# Patient Record
Sex: Female | Born: 1940 | Race: Black or African American | Hispanic: No | Smoking: Never smoker
Health system: Southern US, Community
[De-identification: ages and names within clinical notes are randomized; demographics above are authoritative.]

## PROBLEM LIST (undated history)

## (undated) DIAGNOSIS — R7303 Prediabetes: Secondary | ICD-10-CM

## (undated) DIAGNOSIS — F0390 Unspecified dementia without behavioral disturbance: Secondary | ICD-10-CM

## (undated) DIAGNOSIS — M1990 Unspecified osteoarthritis, unspecified site: Secondary | ICD-10-CM

## (undated) DIAGNOSIS — N189 Chronic kidney disease, unspecified: Secondary | ICD-10-CM

## (undated) DIAGNOSIS — I10 Essential (primary) hypertension: Secondary | ICD-10-CM

## (undated) DIAGNOSIS — D329 Benign neoplasm of meninges, unspecified: Secondary | ICD-10-CM

## (undated) DIAGNOSIS — I517 Cardiomegaly: Secondary | ICD-10-CM

## (undated) DIAGNOSIS — F32A Depression, unspecified: Secondary | ICD-10-CM

## (undated) DIAGNOSIS — N390 Urinary tract infection, site not specified: Secondary | ICD-10-CM

## (undated) DIAGNOSIS — M797 Fibromyalgia: Secondary | ICD-10-CM

## (undated) HISTORY — PX: HAND SURGERY: SHX662

---

## 2012-10-13 DIAGNOSIS — M5430 Sciatica, unspecified side: Secondary | ICD-10-CM | POA: Insufficient documentation

## 2012-10-13 DIAGNOSIS — M1990 Unspecified osteoarthritis, unspecified site: Secondary | ICD-10-CM | POA: Insufficient documentation

## 2012-10-13 DIAGNOSIS — I10 Essential (primary) hypertension: Secondary | ICD-10-CM | POA: Insufficient documentation

## 2012-12-01 DIAGNOSIS — R011 Cardiac murmur, unspecified: Secondary | ICD-10-CM | POA: Insufficient documentation

## 2013-01-17 DIAGNOSIS — I517 Cardiomegaly: Secondary | ICD-10-CM | POA: Insufficient documentation

## 2015-11-22 DIAGNOSIS — K219 Gastro-esophageal reflux disease without esophagitis: Secondary | ICD-10-CM | POA: Insufficient documentation

## 2017-02-17 DIAGNOSIS — M160 Bilateral primary osteoarthritis of hip: Secondary | ICD-10-CM | POA: Insufficient documentation

## 2019-04-28 DIAGNOSIS — D329 Benign neoplasm of meninges, unspecified: Secondary | ICD-10-CM | POA: Diagnosis present

## 2020-04-24 DIAGNOSIS — D126 Benign neoplasm of colon, unspecified: Secondary | ICD-10-CM | POA: Insufficient documentation

## 2020-09-26 ENCOUNTER — Other Ambulatory Visit: Payer: Self-pay | Admitting: Neurosurgery

## 2020-09-26 DIAGNOSIS — D329 Benign neoplasm of meninges, unspecified: Secondary | ICD-10-CM

## 2020-09-28 ENCOUNTER — Other Ambulatory Visit: Payer: Self-pay | Admitting: Neurosurgery

## 2020-09-28 ENCOUNTER — Other Ambulatory Visit (HOSPITAL_COMMUNITY): Payer: Self-pay | Admitting: Neurosurgery

## 2020-09-28 DIAGNOSIS — D329 Benign neoplasm of meninges, unspecified: Secondary | ICD-10-CM

## 2020-10-16 ENCOUNTER — Encounter: Payer: Self-pay | Admitting: *Deleted

## 2020-10-16 ENCOUNTER — Other Ambulatory Visit: Payer: Self-pay

## 2020-10-16 ENCOUNTER — Emergency Department
Admission: EM | Admit: 2020-10-16 | Discharge: 2020-10-16 | Disposition: A | Discharge status: Home / Self Care | Payer: Medicare Other | Attending: Emergency Medicine | Admitting: Emergency Medicine

## 2020-10-16 DIAGNOSIS — N3090 Cystitis, unspecified without hematuria: Secondary | ICD-10-CM

## 2020-10-16 DIAGNOSIS — I10 Essential (primary) hypertension: Secondary | ICD-10-CM | POA: Diagnosis not present

## 2020-10-16 DIAGNOSIS — Z86011 Personal history of benign neoplasm of the brain: Secondary | ICD-10-CM | POA: Diagnosis not present

## 2020-10-16 DIAGNOSIS — N3000 Acute cystitis without hematuria: Secondary | ICD-10-CM | POA: Insufficient documentation

## 2020-10-16 DIAGNOSIS — R319 Hematuria, unspecified: Secondary | ICD-10-CM | POA: Diagnosis present

## 2020-10-16 HISTORY — DX: Unspecified osteoarthritis, unspecified site: M19.90

## 2020-10-16 HISTORY — DX: Benign neoplasm of meninges, unspecified: D32.9

## 2020-10-16 HISTORY — DX: Essential (primary) hypertension: I10

## 2020-10-16 HISTORY — DX: Cardiomegaly: I51.7

## 2020-10-16 HISTORY — DX: Urinary tract infection, site not specified: N39.0

## 2020-10-16 LAB — URINALYSIS, COMPLETE (UACMP) WITH MICROSCOPIC
Bilirubin Urine: NEGATIVE
Glucose, UA: NEGATIVE mg/dL
Hgb urine dipstick: NEGATIVE
Ketones, ur: NEGATIVE mg/dL
Nitrite: NEGATIVE
Protein, ur: NEGATIVE mg/dL
Specific Gravity, Urine: 1.015 (ref 1.005–1.030)
pH: 5 (ref 5.0–8.0)

## 2020-10-16 LAB — BASIC METABOLIC PANEL
Anion gap: 7 (ref 5–15)
BUN: 36 mg/dL — ABNORMAL HIGH (ref 8–23)
CO2: 22 mmol/L (ref 22–32)
Calcium: 10.5 mg/dL — ABNORMAL HIGH (ref 8.9–10.3)
Chloride: 109 mmol/L (ref 98–111)
Creatinine, Ser: 1.57 mg/dL — ABNORMAL HIGH (ref 0.44–1.00)
GFR, Estimated: 33 mL/min — ABNORMAL LOW (ref >60)
Glucose, Bld: 98 mg/dL (ref 70–99)
Potassium: 4.9 mmol/L (ref 3.5–5.1)
Sodium: 138 mmol/L (ref 135–145)

## 2020-10-16 LAB — CBC WITH DIFFERENTIAL/PLATELET
Abs Immature Granulocytes: 0 10*3/uL (ref 0.00–0.07)
Basophils Absolute: 0 10*3/uL (ref 0.0–0.1)
Basophils Relative: 0 %
Eosinophils Absolute: 0.2 10*3/uL (ref 0.0–0.5)
Eosinophils Relative: 3 %
HCT: 35.2 % — ABNORMAL LOW (ref 36.0–46.0)
Hemoglobin: 11.6 g/dL — ABNORMAL LOW (ref 12.0–15.0)
Immature Granulocytes: 0 %
Lymphocytes Relative: 47 %
Lymphs Abs: 2.3 10*3/uL (ref 0.7–4.0)
MCH: 30.9 pg (ref 26.0–34.0)
MCHC: 33 g/dL (ref 30.0–36.0)
MCV: 93.6 fL (ref 80.0–100.0)
Monocytes Absolute: 0.6 10*3/uL (ref 0.1–1.0)
Monocytes Relative: 12 %
Neutro Abs: 1.9 10*3/uL (ref 1.7–7.7)
Neutrophils Relative %: 38 %
Platelets: 162 10*3/uL (ref 150–400)
RBC: 3.76 MIL/uL — ABNORMAL LOW (ref 3.87–5.11)
RDW: 14.4 % (ref 11.5–15.5)
WBC: 5 10*3/uL (ref 4.0–10.5)
nRBC: 0 % (ref 0.0–0.2)

## 2020-10-16 MED ORDER — SULFAMETHOXAZOLE-TRIMETHOPRIM 800-160 MG PO TABS: 1.0000 | ORAL_TABLET | Freq: Two times a day (BID) | ORAL | 0 refills | Status: AC

## 2020-10-16 MED ORDER — FLUCONAZOLE 150 MG PO TABS: 150.0000 mg | ORAL_TABLET | Freq: Once | ORAL | 0 refills | Status: AC

## 2020-10-16 MED ORDER — SULFAMETHOXAZOLE-TRIMETHOPRIM 800-160 MG PO TABS
1.0000 | ORAL_TABLET | Freq: Once | ORAL | Status: AC
  Administered 2020-10-16: 1 via ORAL
  Filled 2020-10-16: qty 1

## 2020-10-16 MED ORDER — CEPHALEXIN 500 MG PO CAPS: 500.0000 mg | ORAL_CAPSULE | Freq: Once | ORAL | Status: DC

## 2020-10-16 NOTE — ED Triage Notes (Signed)
Pt reports blood in urine and back pain for 1 day.  No n/v/  Hx uti's  Family with pt  Pt alert.

## 2020-10-16 NOTE — ED Notes (Signed)
Pt very anxious about blood draw. This nurse placed 22g IV but vein blew. Will call second nurse for lab draw.

## 2020-10-16 NOTE — ED Provider Notes (Signed)
[facility] Emergency Department Provider Note   ____________________________________________   Event Date/Time   First MD Initiated Contact with Patient 10/16/20 1750     (approximate)  I have reviewed the triage vital signs and the nursing notes.   HISTORY  Chief Complaint Hematuria    HPI Kelli Shaffer is a 80 y.o. female with past medical history of hypertension, osteoarthritis, LVH, and meningioma who presents to the ED complaining of hematuria.  Patient states that she started noticing burning when she urinates when she got up this morning.  She also noticed a small amount of blood in her urine around the same time.  She denies any fevers, abdominal pain, nausea, vomiting, or flank pain.  She has not noticed any blood in her stool and denies any vaginal bleeding.  Daughter states that patient deals with frequent UTIs and current symptoms are similar.        Past Medical History:  Diagnosis Date  . Frequent UTI   . Hypertension   . LVH (left ventricular hypertrophy)   . Meningioma ([place])   . Osteoarthritis     There are no problems to display for this patient.   History reviewed. No pertinent surgical history.  Prior to Admission medications   Medication Sig Start Date End Date Taking? Authorizing Provider  fluconazole (DIFLUCAN) 150 MG tablet Take 1 tablet (150 mg total) by mouth once for 1 dose. Take if symptoms of yeast infection develop 10/16/20 10/16/20 Yes Blake Divine, MD  sulfamethoxazole-trimethoprim (BACTRIM DS) 800-160 MG tablet Take 1 tablet by mouth 2 (two) times daily for 5 days. 10/16/20 10/21/20 Yes Blake Divine, MD    Allergies Penicillins  No family history on file.  Social History Social History   Tobacco Use  . Smoking status: Never Smoker  . Smokeless tobacco: Never Used    Review of Systems  Constitutional: No fever/chills Eyes: No visual changes. ENT: No sore throat. Cardiovascular: Denies chest  pain. Respiratory: Denies shortness of breath. Gastrointestinal: No abdominal pain.  No nausea, no vomiting.  No diarrhea.  No constipation. Genitourinary: Positive for dysuria and hematuria. Musculoskeletal: Negative for back pain. Skin: Negative for rash. Neurological: Negative for headaches, focal weakness or numbness.  ____________________________________________   PHYSICAL EXAM:  VITAL SIGNS: ED Triage Vitals  Enc Vitals Group     BP      Pulse      Resp      Temp      Temp src      SpO2      Weight      Height      Head Circumference      Peak Flow      Pain Score      Pain Loc      Pain Edu?      Excl. in [place]?     Constitutional: Alert and oriented. Eyes: Conjunctivae are normal. Head: Atraumatic. Nose: No congestion/rhinnorhea. Mouth/Throat: Mucous membranes are moist. Neck: Normal ROM Cardiovascular: Normal rate, regular rhythm. Grossly normal heart sounds. Respiratory: Normal respiratory effort.  No retractions. Lungs CTAB. Gastrointestinal: Soft and nontender.  No CVA tenderness bilaterally.  No distention. Genitourinary: deferred Musculoskeletal: No lower extremity tenderness nor edema. Neurologic:  Normal speech and language. No gross focal neurologic deficits are appreciated. Skin:  Skin is warm, dry and intact. No rash noted. Psychiatric: Mood and affect are normal. Speech and behavior are normal.  ____________________________________________   LABS (all labs ordered are listed, but only abnormal  results are displayed)  Labs Reviewed  BASIC METABOLIC PANEL - Abnormal; Notable for the following components:      Result Value   BUN 36 (*)    Creatinine, Ser 1.57 (*)    Calcium 10.5 (*)    GFR, Estimated 33 (*)    All other components within normal limits  CBC WITH DIFFERENTIAL/PLATELET - Abnormal; Notable for the following components:   RBC 3.76 (*)    Hemoglobin 11.6 (*)    HCT 35.2 (*)    All other components within normal limits   URINALYSIS, COMPLETE (UACMP) WITH MICROSCOPIC - Abnormal; Notable for the following components:   Color, Urine YELLOW (*)    APPearance HAZY (*)    Leukocytes,Ua TRACE (*)    Bacteria, UA MANY (*)    All other components within normal limits  URINE CULTURE    PROCEDURES  Procedure(s) performed (including Critical Care):  Procedures   ____________________________________________   INITIAL IMPRESSION / ASSESSMENT AND PLAN / ED COURSE       80 year old female with past medical history of hypertension, osteoarthritis, LVH, and meningioma who presents to the ED for dysuria and hematuria starting this morning.  Patient is well-appearing with reassuring vital signs, no CVA tenderness to suggest pyelonephritis.  We will check UA and basic labs, suspect her symptoms are due to UTI.  UA concerning for infection, we will send for culture and treat with Bactrim given her penicillin allergy.  Labs are reassuring, renal function similar to previous.  Patient's daughter is requesting dose of Diflucan to be prescribed as she frequently develops yeast infections following UTI treatment.  Daughter counseled to have patient return to the ED for any new or worsening symptoms, daughter agrees with plan.      ____________________________________________   FINAL CLINICAL IMPRESSION(S) / ED DIAGNOSES  Final diagnoses:  Cystitis     ED Discharge Orders         Ordered    sulfamethoxazole-trimethoprim (BACTRIM DS) 800-160 MG tablet  2 times daily        10/16/20 2029    fluconazole (DIFLUCAN) 150 MG tablet   Once        10/16/20 2029           Note:  This document was prepared using Dragon voice recognition software and may include unintentional dictation errors.   Blake Divine, MD 10/16/20 2030

## 2020-10-16 NOTE — ED Notes (Signed)
Pt to ED because she believes she has UTI. Has pain and burning with urination that began this morning. Only has pain with voiding. In NAD, daughter at bedside.

## 2020-10-19 LAB — URINE CULTURE: Culture: >=100000 — AB

## 2020-11-08 DIAGNOSIS — Z01818 Encounter for other preprocedural examination: Secondary | ICD-10-CM | POA: Insufficient documentation

## 2020-12-18 ENCOUNTER — Ambulatory Visit: Payer: Self-pay

## 2020-12-25 ENCOUNTER — Other Ambulatory Visit: Payer: Self-pay

## 2020-12-25 ENCOUNTER — Ambulatory Visit
Admission: RE | Admit: 2020-12-25 | Discharge: 2020-12-25 | Disposition: A | Discharge status: Home / Self Care | Payer: Medicare Other | Source: Ambulatory Visit | Attending: Neurosurgery | Admitting: Neurosurgery

## 2020-12-25 DIAGNOSIS — D329 Benign neoplasm of meninges, unspecified: Secondary | ICD-10-CM | POA: Diagnosis present

## 2020-12-25 MED ORDER — GADOBUTROL 1 MMOL/ML IV SOLN
8.0000 mL | Freq: Once | INTRAVENOUS | Status: AC | PRN
  Administered 2020-12-25: 8 mL via INTRAVENOUS

## 2020-12-25 MED ORDER — GADOBUTROL 1 MMOL/ML IV SOLN: 10.0000 mL | Freq: Once | INTRAVENOUS | Status: DC | PRN

## 2021-01-31 ENCOUNTER — Other Ambulatory Visit: Payer: Self-pay | Admitting: Radiation Therapy

## 2021-02-06 ENCOUNTER — Other Ambulatory Visit: Payer: Self-pay | Admitting: Neurosurgery

## 2021-02-08 ENCOUNTER — Other Ambulatory Visit: Payer: Self-pay | Admitting: Radiation Therapy

## 2021-02-08 ENCOUNTER — Other Ambulatory Visit: Payer: Self-pay | Admitting: Neurosurgery

## 2021-02-25 NOTE — Pre-Procedure Instructions (Signed)
Surgical Instructions    Your procedure is scheduled on Tuesday, August 30th.  Report to [facility] Main Entrance "A" at 5:30 A.M., then check in with the Admitting office.  Call this number if you have problems the morning of surgery:  [phone]   If you have any questions prior to your surgery date call [phone]: Open Monday-Friday 8am-4pm    Remember:  Do not eat or drink after midnight the night before your surgery    Take these medicines the morning of surgery with A SIP OF WATER  Carvedilol (coreg) Acetaminophen (Tylenol)-as needed Tramadol (ultram)-as needed for pain.  As of today, STOP taking any Aspirin (unless otherwise instructed by your surgeon) Aleve, Naproxen, Ibuprofen, Motrin, Advil, Goody's, BC's, all herbal medications, fish oil, and all vitamins.                     Do NOT Smoke (Tobacco/Vaping) or drink Alcohol 24 hours prior to your procedure.  If you use a CPAP at night, you may bring all equipment for your overnight stay.   Contacts, glasses, piercing's, hearing aid's, dentures or partials may not be worn into surgery, please bring cases for these belongings.    For patients admitted to the hospital, discharge time will be determined by your treatment team.   Patients discharged the day of surgery will not be allowed to drive home, and someone needs to stay with them for 24 hours.  ONLY 1 SUPPORT PERSON MAY BE PRESENT WHILE YOU ARE IN SURGERY. IF YOU ARE TO BE ADMITTED ONCE YOU ARE IN YOUR ROOM YOU WILL BE ALLOWED TWO (2) VISITORS.  Minor children may have two parents present. Special consideration for safety and communication needs will be reviewed on a case by case basis.   Special instructions:   [place]- Preparing For Surgery  Before surgery, you can play an important role. Because skin is not sterile, your skin needs to be as free of germs as possible. You can reduce the number of germs on your skin by washing with CHG (chlorahexidine  gluconate) Soap before surgery.  CHG is an antiseptic cleaner which kills germs and bonds with the skin to continue killing germs even after washing.    Oral Hygiene is also important to reduce your risk of infection.  Remember - BRUSH YOUR TEETH THE MORNING OF SURGERY WITH YOUR REGULAR TOOTHPASTE  Please do not use if you have an allergy to CHG or antibacterial soaps. If your skin becomes reddened/irritated stop using the CHG.  Do not shave (including legs and underarms) for at least 48 hours prior to first CHG shower. It is OK to shave your face.  Please follow these instructions carefully.   Shower the NIGHT BEFORE SURGERY and the MORNING OF SURGERY  If you chose to wash your hair, wash your hair first as usual with your normal shampoo.  After you shampoo, rinse your hair and body thoroughly to remove the shampoo.  Use CHG Soap as you would any other liquid soap. You can apply CHG directly to the skin and wash gently with a scrungie or a clean washcloth.   Apply the CHG Soap to your body ONLY FROM THE NECK DOWN.  Do not use on open wounds or open sores. Avoid contact with your eyes, ears, mouth and genitals (private parts). Wash Face and genitals (private parts)  with your normal soap.   Wash thoroughly, paying special attention to the area where your surgery will be performed.  Thoroughly rinse your body with warm water from the neck down.  DO NOT shower/wash with your normal soap after using and rinsing off the CHG Soap.  Pat yourself dry with a CLEAN TOWEL.  Wear CLEAN PAJAMAS to bed the night before surgery  Place CLEAN SHEETS on your bed the night before your surgery  DO NOT SLEEP WITH PETS.   Day of Surgery: Shower with CHG soap. Do not wear jewelry, make up, nail polish, gel polish, artificial nails, or any other type of covering on natural nails including finger and toenails. If patients have artificial nails, gel coating, etc. that need to be removed by a nail salon  please have this removed prior to surgery. Surgery may need to be canceled/delayed if the surgeon/ anesthesia feels like the patient is unable to be adequately monitored. Do not wear lotions, powders, perfumes, or deodorant. Do not shave 48 hours prior to surgery.   Do not bring valuables to the hospital. [facility] is not responsible for any belongings or valuables. Wear Clean/Comfortable clothing the morning of surgery Remember to brush your teeth WITH YOUR REGULAR TOOTHPASTE.   Please read over the following fact sheets that you were given.

## 2021-02-26 ENCOUNTER — Encounter (HOSPITAL_COMMUNITY): Payer: Self-pay

## 2021-02-26 ENCOUNTER — Encounter (HOSPITAL_COMMUNITY)
Admission: RE | Admit: 2021-02-26 | Discharge: 2021-02-26 | Disposition: A | Discharge status: Home / Self Care | Payer: Medicare Other | Source: Ambulatory Visit | Attending: Neurosurgery | Admitting: Neurosurgery

## 2021-02-26 ENCOUNTER — Other Ambulatory Visit: Payer: Self-pay

## 2021-02-26 DIAGNOSIS — Z01818 Encounter for other preprocedural examination: Secondary | ICD-10-CM | POA: Insufficient documentation

## 2021-02-26 LAB — CBC
HCT: 36.8 % (ref 36.0–46.0)
Hemoglobin: 11.5 g/dL — ABNORMAL LOW (ref 12.0–15.0)
MCH: 31.2 pg (ref 26.0–34.0)
MCHC: 31.3 g/dL (ref 30.0–36.0)
MCV: 99.7 fL (ref 80.0–100.0)
Platelets: 173 10*3/uL (ref 150–400)
RBC: 3.69 MIL/uL — ABNORMAL LOW (ref 3.87–5.11)
RDW: 14.1 % (ref 11.5–15.5)
WBC: 5.9 10*3/uL (ref 4.0–10.5)
nRBC: 0 % (ref 0.0–0.2)

## 2021-02-26 LAB — BASIC METABOLIC PANEL
Anion gap: 5 (ref 5–15)
BUN: 26 mg/dL — ABNORMAL HIGH (ref 8–23)
CO2: 26 mmol/L (ref 22–32)
Calcium: 9.9 mg/dL (ref 8.9–10.3)
Chloride: 108 mmol/L (ref 98–111)
Creatinine, Ser: 1.66 mg/dL — ABNORMAL HIGH (ref 0.44–1.00)
GFR, Estimated: 31 mL/min — ABNORMAL LOW (ref >60)
Glucose, Bld: 122 mg/dL — ABNORMAL HIGH (ref 70–99)
Potassium: 4.3 mmol/L (ref 3.5–5.1)
Sodium: 139 mmol/L (ref 135–145)

## 2021-02-26 LAB — TYPE AND SCREEN
ABO/RH(D): O NEG
Antibody Screen: NEGATIVE

## 2021-02-26 NOTE — Progress Notes (Signed)
PCP - Dr. Valarie Cones Cardiologist - denies  Chest x-ray - n/a EKG - 02/26/21 Stress Test - ? Possibly in 2004 in [place]. No follow-up needed. Reported to be normal. ECHO - 2014 at [facility]. Records requested.  Cardiac Cath - denies  Sleep Study - denies CPAP - denies  Blood Thinner Instructions: n/a Aspirin Instructions: n/a  COVID TEST- Pt lives in [place] and daughter stated that she would not be coming back to [facility] for a covid test. Pt told that she could use an outside source as long as it was a PCR test and it was done within 4 days of surgery. In the event that this could not be obtained, daughter was given directions, instructions, map and requisition form for testing.   Anesthesia review: Yes, pending cardiac records and EKG review.   Patient denies shortness of breath, fever, cough and chest pain at PAT appointment   All instructions explained to the patient, with a verbal understanding of the material. Patient agrees to go over the instructions while at home for a better understanding. Patient also instructed to self quarantine after being tested for COVID-19. The opportunity to ask questions was provided.

## 2021-02-27 DIAGNOSIS — M5137 Other intervertebral disc degeneration, lumbosacral region: Secondary | ICD-10-CM | POA: Insufficient documentation

## 2021-02-27 DIAGNOSIS — M4712 Other spondylosis with myelopathy, cervical region: Secondary | ICD-10-CM | POA: Insufficient documentation

## 2021-02-27 NOTE — Progress Notes (Signed)
Anesthesia Chart Review:  Patient was evaluated by cardiologist Dr. Newman Pies at [place] in 2014 for abnormal EKG and murmur.  EKG 11/17/12 that showed NSR, LVH with repolarization abnormalities, anterior ST elevation, probably due to LVH (copy of this EKG is on patient's physical chart).  Dr. Newman Pies ordered echocardiogram which was done 12/29/2012 showed moderate LVH, normal LV systolic function with near obliteration of the LV cavity, EF 80%.  No wall motion abnormalities, no LVOT gradient.  No significant valvular disease.  Preop labs reviewed, creatinine elevated 1.66 (stable compared with labs from 10/16/2020 showing creatinine 1.57), mild anemia with hemoglobin 11.5, otherwise unremarkable.  Reviewed patient history and EKG tracings with Dr. Tobias Alexander.  He advised patient okay to proceed with surgery as planned barring acute status change.  EKG 02/26/21: Sinus bradycardia. Rate 54. T wave abnormality, consider lateral ischemia  TTE 12/29/2012 (see copy on chart): Preparation: Moderate concentric LVH. Normal LV systolic function with near obliteration of the LV cavity, EF 80%.  No focal wall motion abnormalities. No LVOT gradient. Mild diastolic dysfunction (impaired relaxation). Mild left atrial enlargement. Mild aortic sclerosis, no stenosis or regurgitation. No significant valvular disease. Normal right heart.    Wynonia Musty [facility] Short Stay Center/Anesthesiology Phone [phone] 02/28/2021 9:47 AM

## 2021-02-28 NOTE — Anesthesia Preprocedure Evaluation (Addendum)
Anesthesia Evaluation  Patient identified by MRN, date of birth, ID band Patient awake    Reviewed: Allergy & Precautions, NPO status , Patient's Chart, lab work & pertinent test results, reviewed documented beta blocker date and time   History of Anesthesia Complications (+) history of anesthetic complications  Airway Mallampati: II  TM Distance: >3 FB Neck ROM: Full    Dental  (+) Missing, Dental Advisory Given   Pulmonary neg pulmonary ROS,    Pulmonary exam normal        Cardiovascular hypertension, Pt. on home beta blockers Normal cardiovascular exam  Patient was evaluated by cardiologist Dr. Newman Pies at [place] in 2014 for abnormal EKG and murmur.  EKG 11/17/12 that showed NSR, LVH with repolarization abnormalities, anterior ST elevation, probably due to LVH (copy of this EKG is on patient's physical chart).  Dr. Newman Pies ordered echocardiogram which was done 12/29/2012 showed moderate LVH, normal LV systolic function with near obliteration of the LV cavity, EF 80%.  No wall motion abnormalities, no LVOT gradient.  No significant valvular disease.   Neuro/Psych negative neurological ROS     GI/Hepatic negative GI ROS, Neg liver ROS,   Endo/Other  negative endocrine ROS  Renal/GU negative Renal ROS     Musculoskeletal negative musculoskeletal ROS (+)   Abdominal   Peds  Hematology negative hematology ROS (+)   Anesthesia Other Findings   Reproductive/Obstetrics                           Anesthesia Physical Anesthesia Plan  ASA: 3  Anesthesia Plan: General   Post-op Pain Management:    Induction: Intravenous  PONV Risk Score and Plan: 4 or greater and Ondansetron, Dexamethasone, Treatment may vary due to age or medical condition and Diphenhydramine  Airway Management Planned: Oral ETT  Additional Equipment: Arterial line  Intra-op Plan:   Post-operative Plan:  Extubation in OR  Informed Consent: I have reviewed the patients History and Physical, chart, labs and discussed the procedure including the risks, benefits and alternatives for the proposed anesthesia with the patient or authorized representative who has indicated his/her understanding and acceptance.     Dental advisory given  Plan Discussed with: Anesthesiologist and CRNA  Anesthesia Plan Comments: (PAT note by Karoline Caldwell, PA-C: Patient was evaluated by cardiologist Dr. Newman Pies at [place] in 2014 for abnormal EKG and murmur.  EKG 11/17/12 that showed NSR, LVH with repolarization abnormalities, anterior ST elevation, probably due to LVH (copy of this EKG is on patient's physical chart).  Dr. Newman Pies ordered echocardiogram which was done 12/29/2012 showed moderate LVH, normal LV systolic function with near obliteration of the LV cavity, EF 80%.  No wall motion abnormalities, no LVOT gradient.  No significant valvular disease.  Preop labs reviewed, creatinine elevated 1.66 (stable compared with labs from 10/16/2020 showing creatinine 1.57), mild anemia with hemoglobin 11.5, otherwise unremarkable.  Reviewed patient history and EKG tracings with Dr. Tobias Alexander.  He advised patient okay to proceed with surgery as planned barring acute status change.  EKG 02/26/21: Sinus bradycardia. Rate 54. T wave abnormality, consider lateral ischemia  TTE 12/29/2012 (see copy on chart): Preparation: Moderate concentric LVH. Normal LV systolic function with near obliteration of the LV cavity, EF 80%.  No focal wall motion abnormalities. No LVOT gradient. Mild diastolic dysfunction (impaired relaxation). Mild left atrial enlargement. Mild aortic sclerosis, no stenosis or regurgitation. No significant valvular disease. Normal right heart. )  Anesthesia Quick Evaluation  

## 2021-03-01 ENCOUNTER — Other Ambulatory Visit: Payer: Self-pay

## 2021-03-01 ENCOUNTER — Other Ambulatory Visit
Admission: RE | Admit: 2021-03-01 | Discharge: 2021-03-01 | Disposition: A | Discharge status: Home / Self Care | Payer: Medicare Other | Source: Ambulatory Visit | Attending: Neurosurgery | Admitting: Neurosurgery

## 2021-03-01 DIAGNOSIS — Z20822 Contact with and (suspected) exposure to covid-19: Secondary | ICD-10-CM | POA: Diagnosis not present

## 2021-03-01 DIAGNOSIS — Z01812 Encounter for preprocedural laboratory examination: Secondary | ICD-10-CM | POA: Diagnosis present

## 2021-03-01 LAB — SARS CORONAVIRUS 2 (TAT 6-24 HRS): SARS Coronavirus 2: NEGATIVE

## 2021-03-05 ENCOUNTER — Encounter (HOSPITAL_COMMUNITY): Payer: Self-pay | Admitting: Neurosurgery

## 2021-03-05 ENCOUNTER — Inpatient Hospital Stay (HOSPITAL_COMMUNITY): Payer: Medicare Other

## 2021-03-05 ENCOUNTER — Inpatient Hospital Stay (HOSPITAL_COMMUNITY)
Admission: RE | Admit: 2021-03-05 | Discharge: 2021-03-07 | DRG: 025 | Disposition: A | Discharge status: Home Health | Payer: Medicare Other | Attending: Neurosurgery | Admitting: Neurosurgery

## 2021-03-05 ENCOUNTER — Inpatient Hospital Stay (HOSPITAL_COMMUNITY): Payer: Medicare Other | Admitting: Physician Assistant

## 2021-03-05 ENCOUNTER — Encounter (HOSPITAL_COMMUNITY)
Admission: RE | Disposition: A | Discharge status: Home Health | Payer: Self-pay | Source: Home / Self Care | Attending: Neurosurgery

## 2021-03-05 ENCOUNTER — Other Ambulatory Visit: Payer: Self-pay

## 2021-03-05 DIAGNOSIS — F0390 Unspecified dementia without behavioral disturbance: Secondary | ICD-10-CM | POA: Diagnosis not present

## 2021-03-05 DIAGNOSIS — Z79899 Other long term (current) drug therapy: Secondary | ICD-10-CM | POA: Diagnosis not present

## 2021-03-05 DIAGNOSIS — D329 Benign neoplasm of meninges, unspecified: Secondary | ICD-10-CM | POA: Diagnosis present

## 2021-03-05 DIAGNOSIS — Z20822 Contact with and (suspected) exposure to covid-19: Secondary | ICD-10-CM | POA: Diagnosis not present

## 2021-03-05 DIAGNOSIS — G936 Cerebral edema: Secondary | ICD-10-CM | POA: Diagnosis not present

## 2021-03-05 DIAGNOSIS — D320 Benign neoplasm of cerebral meninges: Principal | ICD-10-CM | POA: Diagnosis present

## 2021-03-05 DIAGNOSIS — I10 Essential (primary) hypertension: Secondary | ICD-10-CM | POA: Diagnosis not present

## 2021-03-05 DIAGNOSIS — Z452 Encounter for adjustment and management of vascular access device: Principal | ICD-10-CM

## 2021-03-05 HISTORY — PX: CRANIOTOMY: SHX93

## 2021-03-05 HISTORY — PX: APPLICATION OF CRANIAL NAVIGATION: SHX6578

## 2021-03-05 LAB — ABO/RH: ABO/RH(D): O NEG

## 2021-03-05 LAB — POCT I-STAT 7, (LYTES, BLD GAS, ICA,H+H)
Acid-base deficit: 4 mmol/L — ABNORMAL HIGH (ref 0.0–2.0)
Bicarbonate: 21.5 mmol/L (ref 20.0–28.0)
Calcium, Ion: 1.26 mmol/L (ref 1.15–1.40)
HCT: 28 % — ABNORMAL LOW (ref 36.0–46.0)
Hemoglobin: 9.5 g/dL — ABNORMAL LOW (ref 12.0–15.0)
O2 Saturation: 100 %
Potassium: 3.9 mmol/L (ref 3.5–5.1)
Sodium: 131 mmol/L — ABNORMAL LOW (ref 135–145)
TCO2: 23 mmol/L (ref 22–32)
pCO2 arterial: 38.6 mmHg (ref 32.0–48.0)
pH, Arterial: 7.354 (ref 7.350–7.450)
pO2, Arterial: 182 mmHg — ABNORMAL HIGH (ref 83.0–108.0)

## 2021-03-05 LAB — MRSA NEXT GEN BY PCR, NASAL: MRSA by PCR Next Gen: NOT DETECTED

## 2021-03-05 SURGERY — CRANIOTOMY TUMOR EXCISION
Anesthesia: General | Laterality: Right

## 2021-03-05 MED ORDER — VANCOMYCIN HCL 1000 MG IV SOLR
INTRAVENOUS | Status: DC | PRN
  Administered 2021-03-05: 1000 mg via INTRAVENOUS

## 2021-03-05 MED ORDER — PHENYLEPHRINE HCL (PRESSORS) 10 MG/ML IV SOLN
INTRAVENOUS | Status: DC | PRN
  Administered 2021-03-05 (×3): 80 ug via INTRAVENOUS

## 2021-03-05 MED ORDER — SODIUM CHLORIDE 0.9 % IV SOLN: INTRAVENOUS | Status: DC | PRN

## 2021-03-05 MED ORDER — DEXAMETHASONE SODIUM PHOSPHATE 4 MG/ML IJ SOLN: 4.0000 mg | Freq: Three times a day (TID) | INTRAMUSCULAR | Status: DC

## 2021-03-05 MED ORDER — BACITRACIN ZINC 500 UNIT/GM EX OINT
TOPICAL_OINTMENT | CUTANEOUS | Status: AC
  Filled 2021-03-05: qty 28.35

## 2021-03-05 MED ORDER — DEXAMETHASONE SODIUM PHOSPHATE 4 MG/ML IJ SOLN
4.0000 mg | Freq: Four times a day (QID) | INTRAMUSCULAR | Status: AC
  Administered 2021-03-06 – 2021-03-07 (×4): 4 mg via INTRAVENOUS
  Filled 2021-03-05 (×4): qty 1

## 2021-03-05 MED ORDER — LIDOCAINE 2% (20 MG/ML) 5 ML SYRINGE
INTRAMUSCULAR | Status: DC | PRN
Start: 2021-03-05 — End: 2021-03-05
  Administered 2021-03-05: 100 mg via INTRAVENOUS

## 2021-03-05 MED ORDER — DEXAMETHASONE SODIUM PHOSPHATE 10 MG/ML IJ SOLN
INTRAMUSCULAR | Status: DC | PRN
Start: 2021-03-05 — End: 2021-03-05
  Administered 2021-03-05: 10 mg via INTRAVENOUS

## 2021-03-05 MED ORDER — ONDANSETRON HCL 4 MG/2ML IJ SOLN
INTRAMUSCULAR | Status: DC | PRN
Start: 2021-03-05 — End: 2021-03-05
  Administered 2021-03-05: 4 mg via INTRAVENOUS

## 2021-03-05 MED ORDER — ONDANSETRON HCL 4 MG/2ML IJ SOLN: 4.0000 mg | INTRAMUSCULAR | Status: DC | PRN

## 2021-03-05 MED ORDER — HYDROCODONE-ACETAMINOPHEN 5-325 MG PO TABS
1.0000 | ORAL_TABLET | ORAL | Status: DC | PRN
  Administered 2021-03-05: 1 via ORAL

## 2021-03-05 MED ORDER — SODIUM CHLORIDE 0.9 % IV SOLN: INTRAVENOUS | Status: DC

## 2021-03-05 MED ORDER — LACTATED RINGERS IV SOLN: INTRAVENOUS | Status: DC

## 2021-03-05 MED ORDER — LIDOCAINE-EPINEPHRINE 1 %-1:100000 IJ SOLN
INTRAMUSCULAR | Status: DC | PRN
  Administered 2021-03-05: 7 mL

## 2021-03-05 MED ORDER — MANNITOL 25 % IV SOLN
INTRAVENOUS | Status: DC | PRN
  Administered 2021-03-05: 50 g via INTRAVENOUS

## 2021-03-05 MED ORDER — PROMETHAZINE HCL 25 MG PO TABS: 12.5000 mg | ORAL_TABLET | ORAL | Status: DC | PRN

## 2021-03-05 MED ORDER — FENTANYL CITRATE (PF) 250 MCG/5ML IJ SOLN
INTRAMUSCULAR | Status: DC | PRN
  Administered 2021-03-05 (×5): 50 ug via INTRAVENOUS

## 2021-03-05 MED ORDER — FLUTICASONE PROPIONATE 50 MCG/ACT NA SUSP
1.0000 | Freq: Every day | NASAL | Status: DC
  Administered 2021-03-06: 2 via NASAL
  Administered 2021-03-07: 1 via NASAL
  Filled 2021-03-05: qty 16

## 2021-03-05 MED ORDER — ROCURONIUM BROMIDE 10 MG/ML (PF) SYRINGE
PREFILLED_SYRINGE | INTRAVENOUS | Status: DC | PRN
  Administered 2021-03-05: 80 mg via INTRAVENOUS

## 2021-03-05 MED ORDER — PHENYLEPHRINE HCL-NACL 20-0.9 MG/250ML-% IV SOLN
INTRAVENOUS | Status: DC | PRN
  Administered 2021-03-05: 25 ug/min via INTRAVENOUS

## 2021-03-05 MED ORDER — BACITRACIN ZINC 500 UNIT/GM EX OINT
TOPICAL_OINTMENT | CUTANEOUS | Status: DC | PRN
  Administered 2021-03-05: 1 via TOPICAL

## 2021-03-05 MED ORDER — LABETALOL HCL 5 MG/ML IV SOLN
10.0000 mg | INTRAVENOUS | Status: DC | PRN
  Administered 2021-03-05: 10 mg via INTRAVENOUS
  Administered 2021-03-05: 20 mg via INTRAVENOUS
  Administered 2021-03-05: 10 mg via INTRAVENOUS
  Administered 2021-03-05 (×2): 20 mg via INTRAVENOUS
  Administered 2021-03-05 (×2): 10 mg via INTRAVENOUS
  Administered 2021-03-05: 20 mg via INTRAVENOUS
  Filled 2021-03-05 (×2): qty 4
  Filled 2021-03-05: qty 8

## 2021-03-05 MED ORDER — DEXAMETHASONE SODIUM PHOSPHATE 10 MG/ML IJ SOLN
6.0000 mg | Freq: Four times a day (QID) | INTRAMUSCULAR | Status: DC
  Administered 2021-03-05: 6 mg via INTRAVENOUS
  Filled 2021-03-05: qty 0.6

## 2021-03-05 MED ORDER — PROPOFOL 10 MG/ML IV BOLUS
INTRAVENOUS | Status: DC | PRN
  Administered 2021-03-05: 100 mg via INTRAVENOUS
  Administered 2021-03-05: 40 mg via INTRAVENOUS
  Administered 2021-03-05 (×2): 30 mg via INTRAVENOUS

## 2021-03-05 MED ORDER — CEFAZOLIN SODIUM-DEXTROSE 2-4 GM/100ML-% IV SOLN: 2.0000 g | Freq: Three times a day (TID) | INTRAVENOUS | Status: DC

## 2021-03-05 MED ORDER — ALBUMIN HUMAN 5 % IV SOLN: INTRAVENOUS | Status: DC | PRN

## 2021-03-05 MED ORDER — 0.9 % SODIUM CHLORIDE (POUR BTL) OPTIME
TOPICAL | Status: DC | PRN
  Administered 2021-03-05: 2000 mL
  Administered 2021-03-05: 1000 mL

## 2021-03-05 MED ORDER — DEXAMETHASONE SODIUM PHOSPHATE 4 MG/ML IJ SOLN
4.0000 mg | Freq: Four times a day (QID) | INTRAMUSCULAR | Status: DC
  Filled 2021-03-05: qty 1

## 2021-03-05 MED ORDER — LABETALOL HCL 5 MG/ML IV SOLN
INTRAVENOUS | Status: AC
  Filled 2021-03-05: qty 4

## 2021-03-05 MED ORDER — PANTOPRAZOLE SODIUM 40 MG IV SOLR
40.0000 mg | Freq: Every day | INTRAVENOUS | Status: DC
  Administered 2021-03-05 – 2021-03-06 (×2): 40 mg via INTRAVENOUS
  Filled 2021-03-05 (×2): qty 40

## 2021-03-05 MED ORDER — THROMBIN 5000 UNITS EX SOLR
CUTANEOUS | Status: AC
  Filled 2021-03-05: qty 5000

## 2021-03-05 MED ORDER — HYDROCODONE-ACETAMINOPHEN 5-325 MG PO TABS
ORAL_TABLET | ORAL | Status: AC
  Filled 2021-03-05: qty 1

## 2021-03-05 MED ORDER — THROMBIN 20000 UNITS EX SOLR
CUTANEOUS | Status: DC | PRN
  Administered 2021-03-05: 20 mL via TOPICAL

## 2021-03-05 MED ORDER — DEXAMETHASONE SODIUM PHOSPHATE 10 MG/ML IJ SOLN
6.0000 mg | Freq: Four times a day (QID) | INTRAMUSCULAR | Status: AC
  Administered 2021-03-05 – 2021-03-06 (×3): 6 mg via INTRAVENOUS
  Filled 2021-03-05 (×3): qty 1

## 2021-03-05 MED ORDER — THROMBIN 5000 UNITS EX SOLR
OROMUCOSAL | Status: DC | PRN
  Administered 2021-03-05: 5 mL via TOPICAL

## 2021-03-05 MED ORDER — ACETAMINOPHEN 650 MG RE SUPP: 650.0000 mg | RECTAL | Status: DC | PRN

## 2021-03-05 MED ORDER — CHLORHEXIDINE GLUCONATE CLOTH 2 % EX PADS: 6.0000 | MEDICATED_PAD | Freq: Once | CUTANEOUS | Status: DC

## 2021-03-05 MED ORDER — SPIRONOLACTONE 25 MG PO TABS
50.0000 mg | ORAL_TABLET | Freq: Every day | ORAL | Status: DC
  Administered 2021-03-06 – 2021-03-07 (×2): 50 mg via ORAL
  Filled 2021-03-05 (×2): qty 2

## 2021-03-05 MED ORDER — LEVETIRACETAM IN NACL 1000 MG/100ML IV SOLN
1000.0000 mg | INTRAVENOUS | Status: AC
  Administered 2021-03-05: 1000 mg via INTRAVENOUS
  Filled 2021-03-05: qty 100

## 2021-03-05 MED ORDER — FENTANYL CITRATE (PF) 250 MCG/5ML IJ SOLN
INTRAMUSCULAR | Status: AC
  Filled 2021-03-05: qty 5

## 2021-03-05 MED ORDER — SUGAMMADEX SODIUM 200 MG/2ML IV SOLN
INTRAVENOUS | Status: DC | PRN
Start: 2021-03-05 — End: 2021-03-05
  Administered 2021-03-05: 400 mg via INTRAVENOUS

## 2021-03-05 MED ORDER — ACETAMINOPHEN 10 MG/ML IV SOLN
INTRAVENOUS | Status: AC
  Filled 2021-03-05: qty 100

## 2021-03-05 MED ORDER — DEXAMETHASONE SODIUM PHOSPHATE 4 MG/ML IJ SOLN
INTRAMUSCULAR | Status: AC
  Filled 2021-03-05: qty 2

## 2021-03-05 MED ORDER — LORATADINE 10 MG PO TABS
10.0000 mg | ORAL_TABLET | Freq: Every day | ORAL | Status: DC
  Administered 2021-03-06 – 2021-03-07 (×2): 10 mg via ORAL
  Filled 2021-03-05 (×2): qty 1

## 2021-03-05 MED ORDER — BUPIVACAINE HCL (PF) 0.5 % IJ SOLN
INTRAMUSCULAR | Status: AC
  Filled 2021-03-05: qty 30

## 2021-03-05 MED ORDER — CHLORHEXIDINE GLUCONATE 0.12 % MT SOLN
15.0000 mL | Freq: Once | OROMUCOSAL | Status: AC
  Administered 2021-03-05: 15 mL via OROMUCOSAL
  Filled 2021-03-05: qty 15

## 2021-03-05 MED ORDER — ONDANSETRON HCL 4 MG PO TABS: 4.0000 mg | ORAL_TABLET | ORAL | Status: DC | PRN

## 2021-03-05 MED ORDER — VANCOMYCIN HCL IN DEXTROSE 1-5 GM/200ML-% IV SOLN
1000.0000 mg | Freq: Once | INTRAVENOUS | Status: AC
  Administered 2021-03-05: 1000 mg via INTRAVENOUS
  Filled 2021-03-05: qty 200

## 2021-03-05 MED ORDER — EPHEDRINE SULFATE 50 MG/ML IJ SOLN
INTRAMUSCULAR | Status: DC | PRN
  Administered 2021-03-05: 5 mg via INTRAVENOUS

## 2021-03-05 MED ORDER — DEXAMETHASONE SODIUM PHOSPHATE 4 MG/ML IJ SOLN
4.0000 mg | Freq: Three times a day (TID) | INTRAMUSCULAR | Status: DC
  Filled 2021-03-05: qty 1

## 2021-03-05 MED ORDER — CEFAZOLIN SODIUM-DEXTROSE 2-4 GM/100ML-% IV SOLN
INTRAVENOUS | Status: AC
  Filled 2021-03-05: qty 100

## 2021-03-05 MED ORDER — BISACODYL 10 MG RE SUPP: 10.0000 mg | Freq: Every day | RECTAL | Status: DC | PRN

## 2021-03-05 MED ORDER — VANCOMYCIN HCL IN DEXTROSE 1-5 GM/200ML-% IV SOLN
1000.0000 mg | INTRAVENOUS | Status: DC
  Filled 2021-03-05: qty 200

## 2021-03-05 MED ORDER — ACETAMINOPHEN 10 MG/ML IV SOLN
INTRAVENOUS | Status: DC | PRN
  Administered 2021-03-05: 1000 mg via INTRAVENOUS

## 2021-03-05 MED ORDER — CARVEDILOL 12.5 MG PO TABS
25.0000 mg | ORAL_TABLET | Freq: Two times a day (BID) | ORAL | Status: DC
  Administered 2021-03-05 – 2021-03-07 (×4): 25 mg via ORAL
  Filled 2021-03-05 (×4): qty 2

## 2021-03-05 MED ORDER — ACETAMINOPHEN 325 MG PO TABS
650.0000 mg | ORAL_TABLET | ORAL | Status: DC | PRN
  Administered 2021-03-06 – 2021-03-07 (×4): 650 mg via ORAL
  Filled 2021-03-05 (×4): qty 2

## 2021-03-05 MED ORDER — ORAL CARE MOUTH RINSE: 15.0000 mL | Freq: Once | OROMUCOSAL | Status: AC

## 2021-03-05 MED ORDER — OXYCODONE-ACETAMINOPHEN 5-325 MG PO TABS
1.0000 | ORAL_TABLET | ORAL | Status: DC | PRN
  Administered 2021-03-05: 1 via ORAL
  Filled 2021-03-05: qty 1

## 2021-03-05 MED ORDER — SENNOSIDES-DOCUSATE SODIUM 8.6-50 MG PO TABS: 1.0000 | ORAL_TABLET | Freq: Every evening | ORAL | Status: DC | PRN

## 2021-03-05 MED ORDER — CHLORHEXIDINE GLUCONATE CLOTH 2 % EX PADS
6.0000 | MEDICATED_PAD | Freq: Every day | CUTANEOUS | Status: DC
  Administered 2021-03-06 – 2021-03-07 (×2): 6 via TOPICAL

## 2021-03-05 MED ORDER — THROMBIN 20000 UNITS EX SOLR
CUTANEOUS | Status: AC
  Filled 2021-03-05: qty 20000

## 2021-03-05 MED ORDER — ADULT MULTIVITAMIN W/MINERALS CH
1.0000 | ORAL_TABLET | Freq: Every day | ORAL | Status: DC
  Administered 2021-03-06 – 2021-03-07 (×2): 1 via ORAL
  Filled 2021-03-05 (×2): qty 1

## 2021-03-05 MED ORDER — HYDROCHLOROTHIAZIDE 25 MG PO TABS
25.0000 mg | ORAL_TABLET | Freq: Every day | ORAL | Status: DC
  Administered 2021-03-06 – 2021-03-07 (×2): 25 mg via ORAL
  Filled 2021-03-05 (×2): qty 1

## 2021-03-05 MED ORDER — LEVETIRACETAM IN NACL 500 MG/100ML IV SOLN
500.0000 mg | Freq: Two times a day (BID) | INTRAVENOUS | Status: DC
  Administered 2021-03-05 – 2021-03-07 (×4): 500 mg via INTRAVENOUS
  Filled 2021-03-05 (×4): qty 100

## 2021-03-05 MED ORDER — BUPIVACAINE HCL (PF) 0.5 % IJ SOLN
INTRAMUSCULAR | Status: DC | PRN
  Administered 2021-03-05: 7 mL

## 2021-03-05 MED ORDER — PROPOFOL 10 MG/ML IV BOLUS
INTRAVENOUS | Status: AC
  Filled 2021-03-05: qty 40

## 2021-03-05 MED ORDER — HEMOSTATIC AGENTS (NO CHARGE) OPTIME
TOPICAL | Status: DC | PRN
  Administered 2021-03-05: 1 via TOPICAL

## 2021-03-05 MED ORDER — GADOBUTROL 1 MMOL/ML IV SOLN
8.0000 mL | Freq: Once | INTRAVENOUS | Status: AC | PRN
  Administered 2021-03-05: 8 mL via INTRAVENOUS

## 2021-03-05 MED ORDER — DEXAMETHASONE SODIUM PHOSPHATE 10 MG/ML IJ SOLN
INTRAMUSCULAR | Status: AC
  Filled 2021-03-05: qty 1

## 2021-03-05 MED ORDER — LABETALOL HCL 5 MG/ML IV SOLN
INTRAVENOUS | Status: DC | PRN
  Administered 2021-03-05 (×4): 5 mg via INTRAVENOUS

## 2021-03-05 MED ORDER — HYDRALAZINE HCL 20 MG/ML IJ SOLN
INTRAMUSCULAR | Status: DC | PRN
  Administered 2021-03-05 (×2): 5 mg via INTRAVENOUS

## 2021-03-05 SURGICAL SUPPLY — 97 items
BAG COUNTER SPONGE SURGICOUNT (BAG) ×2 IMPLANT
BAND RUBBER #18 3X1/16 STRL (MISCELLANEOUS) IMPLANT
BENZOIN TINCTURE PRP APPL 2/3 (GAUZE/BANDAGES/DRESSINGS) IMPLANT
BLADE CLIPPER SURG (BLADE) IMPLANT
BLADE SAW GIGLI 16 STRL (MISCELLANEOUS) IMPLANT
BLADE SURG 15 STRL LF DISP TIS (BLADE) IMPLANT
BLADE SURG 15 STRL SS (BLADE)
BLADE ULTRA TIP 2M (BLADE) ×2 IMPLANT
BNDG GAUZE ELAST 4 BULKY (GAUZE/BANDAGES/DRESSINGS) ×2 IMPLANT
BNDG STRETCH 4X75 NS LF (GAUZE/BANDAGES/DRESSINGS) ×2 IMPLANT
BNDG STRETCH 4X75 STRL LF (GAUZE/BANDAGES/DRESSINGS) IMPLANT
BUR ACORN 6.0 PRECISION (BURR) ×2 IMPLANT
BUR ROUND FLUTED 4 SOFT TCH (BURR) IMPLANT
BUR SPIRAL ROUTER 2.3 (BUR) ×2 IMPLANT
CANISTER SUCT 3000ML PPV (MISCELLANEOUS) ×4 IMPLANT
CARTRIDGE OIL MAESTRO DRILL (MISCELLANEOUS) ×1 IMPLANT
CATH VENTRIC 35X38 W/TROCAR LG (CATHETERS) IMPLANT
CLIP VESOCCLUDE MED 6/CT (CLIP) IMPLANT
CNTNR URN SCR LID CUP LEK RST (MISCELLANEOUS) ×1 IMPLANT
CONT SPEC 4OZ STRL OR WHT (MISCELLANEOUS) ×1
COVER MAYO STAND STRL (DRAPES) IMPLANT
DECANTER SPIKE VIAL GLASS SM (MISCELLANEOUS) ×2 IMPLANT
DIFFUSER DRILL AIR PNEUMATIC (MISCELLANEOUS) ×2 IMPLANT
DRAIN SUBARACHNOID (WOUND CARE) IMPLANT
DRAPE HALF SHEET 40X57 (DRAPES) ×2 IMPLANT
DRAPE MICROSCOPE LEICA (MISCELLANEOUS) ×2 IMPLANT
DRAPE NEUROLOGICAL W/INCISE (DRAPES) ×2 IMPLANT
DRAPE STERI IOBAN 125X83 (DRAPES) IMPLANT
DRAPE SURG 17X23 STRL (DRAPES) IMPLANT
DRAPE WARM FLUID 44X44 (DRAPES) ×2 IMPLANT
DRSG ADAPTIC 3X8 NADH LF (GAUZE/BANDAGES/DRESSINGS) IMPLANT
DRSG TELFA 3X8 NADH (GAUZE/BANDAGES/DRESSINGS) ×2 IMPLANT
DURAPREP 6ML APPLICATOR 50/CS (WOUND CARE) ×2 IMPLANT
ELECT REM PT RETURN 9FT ADLT (ELECTROSURGICAL) ×2
ELECTRODE REM PT RTRN 9FT ADLT (ELECTROSURGICAL) ×1 IMPLANT
EVACUATOR 1/8 PVC DRAIN (DRAIN) IMPLANT
EVACUATOR SILICONE 100CC (DRAIN) IMPLANT
FORCEPS BIPOLAR SPETZLER 8 1.0 (NEUROSURGERY SUPPLIES) ×2 IMPLANT
GAUZE 4X4 16PLY ~~LOC~~+RFID DBL (SPONGE) ×4 IMPLANT
GAUZE SPONGE 4X4 12PLY STRL (GAUZE/BANDAGES/DRESSINGS) ×2 IMPLANT
GLOVE EXAM NITRILE XL STR (GLOVE) IMPLANT
GLOVE SURG ENC MOIS LTX SZ7.5 (GLOVE) IMPLANT
GLOVE SURG LTX SZ7 (GLOVE) ×4 IMPLANT
GLOVE SURG UNDER POLY LF SZ7 (GLOVE) IMPLANT
GLOVE SURG UNDER POLY LF SZ7.5 (GLOVE) ×10 IMPLANT
GOWN STRL REUS W/ TWL LRG LVL3 (GOWN DISPOSABLE) ×1 IMPLANT
GOWN STRL REUS W/ TWL XL LVL3 (GOWN DISPOSABLE) ×1 IMPLANT
GOWN STRL REUS W/TWL 2XL LVL3 (GOWN DISPOSABLE) IMPLANT
GOWN STRL REUS W/TWL LRG LVL3 (GOWN DISPOSABLE) ×1
GOWN STRL REUS W/TWL XL LVL3 (GOWN DISPOSABLE) ×1
GRAFT DURAGEN MATRIX 3WX3L (Graft) ×1 IMPLANT
GRAFT DURAGEN MATRIX 3X3 SNGL (Graft) ×1 IMPLANT
HEMOSTAT POWDER KIT SURGIFOAM (HEMOSTASIS) ×2 IMPLANT
HEMOSTAT SURGICEL 2X14 (HEMOSTASIS) ×2 IMPLANT
HOOK DURA 1/2IN (MISCELLANEOUS) ×2 IMPLANT
IV NS 1000ML (IV SOLUTION)
IV NS 1000ML BAXH (IV SOLUTION) IMPLANT
KIT BASIN OR (CUSTOM PROCEDURE TRAY) ×2 IMPLANT
KIT DRAIN CSF ACCUDRAIN (MISCELLANEOUS) IMPLANT
KIT TURNOVER KIT B (KITS) ×2 IMPLANT
KNIFE ARACHNOID DISP AM-24-S (MISCELLANEOUS) ×2 IMPLANT
MARKER SPHERE PSV REFLC 13MM (MARKER) ×4 IMPLANT
NEEDLE HYPO 22GX1.5 SAFETY (NEEDLE) ×2 IMPLANT
NEEDLE SPNL 18GX3.5 QUINCKE PK (NEEDLE) IMPLANT
NS IRRIG 1000ML POUR BTL (IV SOLUTION) ×6 IMPLANT
OIL CARTRIDGE MAESTRO DRILL (MISCELLANEOUS) ×2
PACK BATTERY CMF DISP FOR DVR (ORTHOPEDIC DISPOSABLE SUPPLIES) ×2 IMPLANT
PACK CRANIOTOMY CUSTOM (CUSTOM PROCEDURE TRAY) ×2 IMPLANT
PATTIES SURGICAL .25X.25 (GAUZE/BANDAGES/DRESSINGS) IMPLANT
PATTIES SURGICAL .5 X.5 (GAUZE/BANDAGES/DRESSINGS) IMPLANT
PATTIES SURGICAL .5 X3 (DISPOSABLE) IMPLANT
PATTIES SURGICAL 1/4 X 3 (GAUZE/BANDAGES/DRESSINGS) IMPLANT
PATTIES SURGICAL 1X1 (DISPOSABLE) IMPLANT
PIN MAYFIELD SKULL DISP (PIN) ×2 IMPLANT
PLATE BONE 12 2H TARGET XL (Plate) ×4 IMPLANT
PLATE CRANIAL 4H UNI NEURO III (Plate) ×2 IMPLANT
SCREW UNIII AXS SD 1.5X4 (Screw) ×12 IMPLANT
SPECIMEN JAR SMALL (MISCELLANEOUS) IMPLANT
SPONGE NEURO XRAY DETECT 1X3 (DISPOSABLE) IMPLANT
SPONGE SURGIFOAM ABS GEL 100 (HEMOSTASIS) ×2 IMPLANT
STAPLER VISISTAT 35W (STAPLE) ×2 IMPLANT
STOCKINETTE 6  STRL (DRAPES) ×1
STOCKINETTE 6 STRL (DRAPES) ×1 IMPLANT
SUT ETHILON 3 0 FSL (SUTURE) IMPLANT
SUT ETHILON 3 0 PS 1 (SUTURE) IMPLANT
SUT NURALON 4 0 TR CR/8 (SUTURE) ×2 IMPLANT
SUT SILK 0 TIES 10X30 (SUTURE) IMPLANT
SUT VIC AB 0 CT1 18XCR BRD8 (SUTURE) ×1 IMPLANT
SUT VIC AB 0 CT1 8-18 (SUTURE) ×1
SUT VIC AB 3-0 SH 8-18 (SUTURE) ×2 IMPLANT
TAPE CLOTH 1X10 TAN NS (GAUZE/BANDAGES/DRESSINGS) IMPLANT
TOWEL GREEN STERILE (TOWEL DISPOSABLE) ×2 IMPLANT
TOWEL GREEN STERILE FF (TOWEL DISPOSABLE) ×2 IMPLANT
TRAY FOLEY MTR SLVR 16FR STAT (SET/KITS/TRAYS/PACK) ×2 IMPLANT
TUBE CONNECTING 12X1/4 (SUCTIONS) ×2 IMPLANT
UNDERPAD 30X36 HEAVY ABSORB (UNDERPADS AND DIAPERS) ×2 IMPLANT
WATER STERILE IRR 1000ML POUR (IV SOLUTION) ×2 IMPLANT

## 2021-03-05 NOTE — Anesthesia Procedure Notes (Addendum)
Procedure Name: Intubation Date/Time: 03/05/2021 7:55 AM Performed by: Clearnce Sorrel, CRNA Pre-anesthesia Checklist: Patient identified, Emergency Drugs available, Suction available and Patient being monitored Patient Re-evaluated:Patient Re-evaluated prior to induction Oxygen Delivery Method: Circle System Utilized Preoxygenation: Pre-oxygenation with 100% oxygen Induction Type: IV induction Ventilation: Mask ventilation without difficulty Laryngoscope Size: Mac and 3 Grade View: Grade I Tube type: Oral Tube size: 7.0 mm Number of attempts: 1 Airway Equipment and Method: Stylet and Oral airway Placement Confirmation: ETT inserted through vocal cords under direct vision, positive ETCO2 and breath sounds checked- equal and bilateral Secured at: 21 cm Tube secured with: Tape Dental Injury: Teeth and Oropharynx as per pre-operative assessment  Comments: Placed by Orland Mustard SRNA

## 2021-03-05 NOTE — H&P (Signed)
Chief Complaint  Tumor  History of Present Illness  Kelli Shaffer is a 80 y.o. female initially seen in the outpatient neurosurgery clinic as a second opinion with a known right frontal meningioma.  Patient has had some headaches and progressively worsening dementia.  Per report, her MRI has demonstrated progressively worsening edema associated with the meningioma.  After lengthy discussion, the patient presents today for surgical resection.  Past Medical History   Past Medical History:  Diagnosis Date   Frequent UTI    Hypertension    LVH (left ventricular hypertrophy)    Meningioma ([facility])    Osteoarthritis     Past Surgical History   Past Surgical History:  Procedure Laterality Date   CESAREAN SECTION     HAND SURGERY Left    thumb    Social History   Social History   Tobacco Use   Smoking status: Never   Smokeless tobacco: Never  Vaping Use   Vaping Use: Never used  Substance Use Topics   Alcohol use: Never   Drug use: Never    Medications   Prior to Admission medications   Medication Sig Start Date End Date Taking? Authorizing Provider  acetaminophen (TYLENOL) 500 MG tablet Take 1,000 mg by mouth daily as needed (for pain).   Yes [provider]  carvedilol (COREG) 25 MG tablet Take 25 mg by mouth 2 (two) times daily with a meal.   Yes [provider]  fluticasone (FLONASE) 50 MCG/ACT nasal spray Place 1-2 sprays into both nostrils daily.   Yes [provider]  hydrochlorothiazide (HYDRODIURIL) 25 MG tablet Take 25 mg by mouth daily.   Yes [provider]  Multiple Vitamins-Minerals (ONE-A-DAY PROACTIVE 65+) TABS Take 1 tablet by mouth daily with breakfast.   Yes [provider]  loratadine (CLARITIN) 10 MG tablet Take 10 mg by mouth daily.    [provider]  spironolactone (ALDACTONE) 50 MG tablet Take 50 mg by mouth daily.    [provider]  traMADol (ULTRAM) 50 MG tablet Take 25 mg by mouth every  8 (eight) hours as needed (for pain).    [provider]    Allergies   Allergies  Allergen Reactions   Lactase Other (See Comments) and Cough    Congestion, also   Penicillins Rash    Review of Systems  ROS  Neurologic Exam  Awake, alert, oriented Memory and concentration grossly intact Speech fluent, appropriate CN grossly intact Motor exam: Upper Extremities Deltoid Bicep Tricep Grip  Right 5/5 5/5 5/5 5/5  Left 5/5 5/5 5/5 5/5   Lower Extremities IP Quad PF DF EHL  Right 5/5 5/5 5/5 5/5 5/5  Left 5/5 5/5 5/5 5/5 5/5   Sensation grossly intact to LT  Imaging  MRI of the brain was personally reviewed and demonstrates homogeneously enhancing right anterior frontal convexity extra-axial lesion with significant associated right hemispheric edema.  There is effacement of the right lateral ventricle.  Impression  - 80 y.o. female with progressively worsening edema related to small right frontal likely meningioma  Plan  -We will plan on proceeding with stereotactic right frontal craniotomy for resection of tumor  I have reviewed the indications for the procedure as well as the expected postoperative course and recovery with the patient and her daughter.  We have discussed the associated risks, benefits, and alternatives to surgery.  All her questions were answered.  She provided informed consent to proceed.  Consuella Lose, MD [facility]

## 2021-03-05 NOTE — Anesthesia Procedure Notes (Signed)
Central Venous Catheter Insertion Performed by: Duane Boston, MD, anesthesiologist Start/End8/30/2022 6:56 AM, 03/05/2021 7:06 AM Patient location: Pre-op. Preanesthetic checklist: patient identified, IV checked, site marked, risks and benefits discussed, surgical consent, monitors and equipment checked, pre-op evaluation, timeout performed and anesthesia consent Position: Trendelenburg Lidocaine 1% used for infiltration and patient sedated Hand hygiene performed , maximum sterile barriers used  and Seldinger technique used Catheter size: 8 Fr Total catheter length 16. Central line was placed.Double lumen Procedure performed using ultrasound guided technique. Ultrasound Notes:anatomy identified, needle tip was noted to be adjacent to the nerve/plexus identified, no ultrasound evidence of intravascular and/or intraneural injection and image(s) printed for medical record Attempts: 1 Following insertion, dressing applied, line sutured and Biopatch. Post procedure assessment: blood return through all ports, free fluid flow and no air  Patient tolerated the procedure well with no immediate complications.

## 2021-03-05 NOTE — Op Note (Signed)
NEUROSURGERY OPERATIVE NOTE   PREOP DIAGNOSIS:  Right frontal meningioma   POSTOP DIAGNOSIS: Same  PROCEDURE: Stereotactic right frontal craniotomy for resection of meningioma Use of intraoperative microscope for microdissection  SURGEON: Dr. Consuella Lose, MD  ASSISTANT: None  ANESTHESIA: General Endotracheal  EBL: 300cc  SPECIMENS: Right frontal tumor for permanent pathology  DRAINS: None  COMPLICATIONS: None immediate  CONDITION: Hemodynamically stable to PACU  HISTORY: Kelli Shaffer is a 80 y.o. female initially seen in the outpatient neurosurgery clinic as a second opinion for progressively worsening right frontal meningioma.  Patient apparently previously underwent MRI scan demonstrating a small right anterior frontal angioma with associated edema with serial scans demonstrating progressively worsening edema.  I therefore did recommend surgical resection.  The risks, benefits, and alternatives to surgery as well as the expected postoperative course and recovery were all reviewed in detail with the patient and her daughter.  After all her questions were answered informed consent was obtained and witnessed.  PROCEDURE IN DETAIL: The patient was brought to the operating room. After induction of general anesthesia, the patient was positioned on the operative table in the [place] head holder in the supine position. All pressure points were meticulously padded.  Utilizing the preoperative stereotactic MRI scan, surface landmarks were coregistered until a satisfactory accuracy was achieved.  Stereotactic system was then used to plan out a curvilinear frontotemporal incision.  Skin incision was then marked out and prepped and draped in the usual sterile fashion.  After timeout was conducted, the incision was infiltrated with local anesthetic with epinephrine.  Incision was then made sharply and carried down through the galea.  Raney clips were applied for hemostasis on the skin  edges.  The temporalis muscle and fascia were then incised with the Bovie and a single piece myocutaneous flap was elevated and reflected anteriorly.  High-speed drill was then used to create multiple bur holes which were connected with the craniotome.  A standard frontotemporal craniotomy flap was then elevated.  Hemostasis on the epidural plane was secured with bipolar electrocautery.  I did note a significant amount of bleeding from the frontal bone including along the orbital surface.  This was controlled with bone wax.  High-speed drill was then used to flatten the orbitofrontal surface.  At this point the microscope was draped sterilely and brought into the field and the remainder of the tumor resection was performed under the microscope using microdissection.  The dura was opened in curvilinear fashion based anteriorly.  The meningioma was identified on the orbitofrontal surface.  Bipolar electrocautery was used to coagulate the pia surrounding the tumor.  There did not appear to be a typical peel plane between the meningioma and the frontal lobe.  Using a combination of bipolar electrocautery and blunt dissection, the tumor was dissected away from the surrounding white matter relatively easily initially in the medial superior and lateral planes.  The frontal dura was then incised and I was able to identify the posterior margin of the tumor.  A cottonoid was placed.  The orbitofrontal dura was then cut just posterior to the attachment of the tumor.  The remainder of the tumor was dissected in its posterior plane and removed en bloc with the associated dura.  This was sent for permanent pathology.  The resection cavity was then inspected and no identifiable tumor was seen.  The remaining dural edges did not appear to be pathologic.  The dural edges were coagulated with bipolar.  Hemostasis was then easily secured using a  combination of morselized Gelfoam with thrombin as well as bone wax on the orbital  bone.  The wound was then irrigated with copious amount of normal saline.  No active bleeding was identified.  A piece of DuraGen onlay graft was then placed over the dural surface and the exposed brain.  Bone flap was then replaced and plated with standard titanium plates and screws.  No active bleeding was seen in the temporalis muscle.  Temporalis muscle and fascia was then reapproximated with interrupted 0 Vicryl stitches.  The galea was reapproximated with interrupted 3-0 Vicryl stitches and the skin was closed with staples.  Patient was then removed from the Mayfield head holder and sterile dressing was applied.  At the end of the case all sponge, needle, instrument, and cottonoid counts were correct.  Patient was then extubated and taken to the postanesthesia care unit in stable hemodynamic condition.   Consuella Lose, MD [facility]

## 2021-03-05 NOTE — Anesthesia Procedure Notes (Signed)
Arterial Line Insertion Start/End8/30/2022 7:13 AM, 03/05/2021 7:16 AM Performed by: Duane Boston, MD, Clearnce Sorrel, CRNA, CRNA  Preanesthetic checklist: patient identified, IV checked, site marked, risks and benefits discussed, surgical consent, monitors and equipment checked, pre-op evaluation, timeout performed and anesthesia consent Lidocaine 1% used for infiltration and patient sedated Left, radial was placed Catheter size: 20 G Hand hygiene performed   Attempts: 1 Procedure performed without using ultrasound guided technique. Following insertion, dressing applied and Biopatch. Post procedure assessment: normal and unchanged  Patient tolerated the procedure well with no immediate complications.

## 2021-03-05 NOTE — Anesthesia Postprocedure Evaluation (Signed)
Anesthesia Post Note  Patient: Kelli Shaffer  Procedure(s) Performed: STEREOTACTIC RIGHT FRONTAL CRANIOTOMY FOR RESECTION OF MENINGIOMA (Right) APPLICATION OF CRANIAL NAVIGATION (Right)     Patient location during evaluation: PACU Anesthesia Type: General Level of consciousness: sedated Pain management: pain level controlled Vital Signs Assessment: post-procedure vital signs reviewed and stable Respiratory status: spontaneous breathing and respiratory function stable Cardiovascular status: stable Postop Assessment: no apparent nausea or vomiting Anesthetic complications: no   No notable events documented.  Last Vitals:  Vitals:   03/05/21 1145 03/05/21 1200  BP: (!) 153/76 (!) 169/77  Pulse: 74 75  Resp: 13 14  Temp:    SpO2: 96% 95%    Last Pain:  Vitals:   03/05/21 1130  TempSrc:   PainSc: 0-No pain                 Eila Runyan DANIEL

## 2021-03-05 NOTE — Transfer of Care (Signed)
Immediate Anesthesia Transfer of Care Note  Patient: Kelli Shaffer  Procedure(s) Performed: STEREOTACTIC RIGHT FRONTAL CRANIOTOMY FOR RESECTION OF MENINGIOMA (Right) APPLICATION OF CRANIAL NAVIGATION (Right)  Patient Location: PACU  Anesthesia Type:General  Level of Consciousness: drowsy and patient cooperative  Airway & Oxygen Therapy: Patient Spontanous Breathing and Patient connected to face mask oxygen  Post-op Assessment: Report given to RN and Post -op Vital signs reviewed and stable  Post vital signs: Reviewed and stable  Last Vitals:  Vitals Value Taken Time  BP 139/77 03/05/21 1027  Temp 36.5 C 03/05/21 1027  Pulse 73 03/05/21 1027  Resp 14 03/05/21 1030  SpO2 95 % 03/05/21 1027  Vitals shown include unvalidated device data.  Last Pain:  Vitals:   03/05/21 0623  TempSrc:   PainSc: 0-No pain         Complications: No notable events documented.

## 2021-03-05 NOTE — Progress Notes (Signed)
Patient arrived to 4NICU from PACU at [id number] with no complications. Belongings at bedside: robe, shirt, scarf, and sandals.

## 2021-03-06 ENCOUNTER — Encounter (HOSPITAL_COMMUNITY): Payer: Self-pay | Admitting: Neurosurgery

## 2021-03-06 NOTE — Progress Notes (Signed)
  NEUROSURGERY PROGRESS NOTE   Pt seen and examined. No issues overnight. Minimal HA this am. No new complaints of visual change or new N/T/W.  EXAM: Temp:  [97.6 F (36.4 C)-99.7 F (37.6 C)] 98.8 F (37.1 C) (08/31 0400) Pulse Rate:  [62-89] 69 (08/31 0620) Resp:  [8-27] 10 (08/31 0620) BP: (120-181)/(55-88) 124/56 (08/31 0600) SpO2:  [91 %-100 %] 94 % (08/31 0620) Arterial Line BP: (123-218)/(44-75) 123/44 (08/31 0620) Intake/Output      08/30 0701 08/31 0700 08/31 0701 09/01 0700   I.V. (mL/kg) 1916 (22.1)    IV Piggyback 1451.4    Total Intake(mL/kg) 3367.4 (38.9)    Urine (mL/kg/hr) 3225 (1.6)    Blood 300    Total Output 3525    Net -157.6          Awake, alert, oriented Speech fluent CN grossly intact Good strength throughout, no drift Headwrap in place  LABS: Lab Results  Component Value Date   CREATININE 1.66 (H) 02/26/2021   BUN 26 (H) 02/26/2021   NA 131 (L) 03/05/2021   K 3.9 03/05/2021   CL 108 02/26/2021   CO2 26 02/26/2021   Lab Results  Component Value Date   WBC 5.9 02/26/2021   HGB 9.5 (L) 03/05/2021   HCT 28.0 (L) 03/05/2021   MCV 99.7 02/26/2021   PLT 173 02/26/2021    IMAGING: MRI reviewed demonstrating no further identified tumor. 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 female POD#1 s/p resection right frontal meningioma. Doing well clinically. MRI demonstrates complete resection with incidental small punctate embolic infarcts without clinical correlate.  PLAN: - d/c a-line, CVC, Foley - Mobilize today  - Cont to monitor in ICU   Consuella Lose, MD [facility]

## 2021-03-06 NOTE — Evaluation (Signed)
Occupational Therapy Evaluation Patient Details Name: Kelli Shaffer MRN: [id number] DOB: [birth year] Today's Date: 03/06/2021    History of Present Illness 80 y.o. female initially seen in the outpatient neurosurgery clinic as a second opinion with a known right frontal meningioma.  Patient has had some headaches and progressively worsening dementia. S/p stereotactic right frontal craniotomy for resection of meningioma.   Clinical Impression   Pt admitted with above and presents to therapy with deficits impacting mobility and self-care tasks.  Pt completed bed mobility min guard and min assist ambulatory transfers with RW.  Pt requires mod cues for safety with RW as pt with tendency to push RW too far forward, especially when turning. Pt will benefit from continued OT therapy services acutely to decrease burden of care with ADLs and functional mobility prior to d/c home with [facility].  Therapist spoke with daughter, Kelli Shaffer, who voiced desire to have pt have [facility] upon d/c.  Discussed recommended DME of RW, 3 in1, and tub transfer bench.  She reports that pt will d/c to her sister's home and that she will be there during the day to provide physical assistance and transportation as needed.      Follow Up Recommendations  Home health OT;Supervision/Assistance - 24 hour    Equipment Recommendations  3 in 1 bedside commode;Tub/shower bench;Other (comment) (RW)    Recommendations for Other Services Speech consult     Precautions / Restrictions Precautions Precautions: Fall Restrictions Weight Bearing Restrictions: No      Mobility Bed Mobility Overal bed mobility: Needs Assistance Bed Mobility: Sidelying to Sit   Sidelying to sit: Min guard       General bed mobility comments: required min guard and increased time to transition to sitting EOB    Transfers Overall transfer level: Needs assistance Equipment used: Rolling walker (2 wheeled) Transfers: Sit to/from [employer] Sit to Stand: Min assist Stand pivot transfers: Min assist       General transfer comment: Required cues for safe RW placement as pt with tendency to push RW too far forward, especially when turning    Balance Overall balance assessment: Needs assistance           Standing balance-Leahy Scale: Fair Standing balance comment: Min A with RW with cues for safe positioning of RW during mobility                           ADL either performed or assessed with clinical judgement   ADL Overall ADL's : Needs assistance/impaired         Upper Body Bathing: Supervision/ safety   Lower Body Bathing: Minimal assistance;Moderate assistance;Sit to/from stand   Upper Body Dressing : Minimal assistance   Lower Body Dressing: Moderate assistance;Sit to/from stand   Toilet Transfer: Minimal Patent examiner Details (indicate cue type and reason): stand pivot to recliner with RW, requiring cues for safe RW placement         Functional mobility during ADLs: Rolling walker;Minimal assistance       Vision Baseline Vision/History: 1 Wears glasses Ability to See in Adequate Light: 0 Adequate Patient Visual Report: No change from baseline Vision Assessment?: No apparent visual deficits            Pertinent Vitals/Pain Pain Assessment: No/denies pain     Hand Dominance Right   Extremity/Trunk Assessment Upper Extremity Assessment Upper Extremity Assessment: LUE deficits/detail;Generalized weakness LUE Deficits / Details: mild weakness in LUE and difficulty  with thumb opposition, due to h/o surgery on hand LUE Sensation: WNL LUE Coordination: decreased fine motor   Lower Extremity Assessment Lower Extremity Assessment: Defer to PT evaluation (BLE swelling)       Communication Communication Communication: No difficulties   Cognition Arousal/Alertness: Awake/alert Behavior During Therapy: WFL for tasks assessed/performed Overall Cognitive  Status: History of cognitive impairments - at baseline                                 General Comments: Daughter reports pt with memory deficits and mild dementia.  OT completed BIMS with pt oriented to self, time, place, situation.  Pt able to recall 3/3 words immediately and 2/3 after few minute delay for delayed recall.              Home Living Family/patient expects to be discharged to:: Private residence Living Arrangements: Children Available Help at Discharge: Family;Available 24 hours/day (previously lived alone, plan is to d/c to daughter's home with assistance from 2 adult daughters) Type of Home: House Home Access: Stairs to enter;Ramped entrance Entrance Stairs-Number of Steps: 2   Home Layout: One level     Bathroom Shower/Tub: Teacher, early years/pre: Standard Bathroom Accessibility: Yes How Accessible: Accessible via walker Home Equipment: Kaplan - 4 wheels          Prior Functioning/Environment Level of Independence: Independent with assistive device(s)        Comments: Pt lived alone in senior living apartment.  Pt with h/o multiple falls and would be on the ground for hours before assist would arrive.  Plans to d/c to daughter's home with assistance from 2 daughters.        OT Problem List: Decreased strength;Decreased range of motion;Decreased activity tolerance;Impaired balance (sitting and/or standing);Decreased safety awareness;Decreased cognition;Decreased knowledge of use of DME or AE;Decreased knowledge of precautions;Increased edema      OT Treatment/Interventions: Self-care/ADL training;DME and/or AE instruction;Energy conservation;Therapeutic activities;Cognitive remediation/compensation;Patient/family education;Balance training    OT Goals(Current goals can be found in the care plan section) Acute Rehab OT Goals Patient Stated Goal: to go home with her daughters OT Goal Formulation: With patient/family Time For Goal  Achievement: 03/20/21 Potential to Achieve Goals: Good  OT Frequency: Min 2X/week     AM-PAC OT "6 Clicks" Daily Activity     Outcome Measure Help from another person eating meals?: None Help from another person taking care of personal grooming?: A Little Help from another person toileting, which includes using toliet, bedpan, or urinal?: A Little Help from another person bathing (including washing, rinsing, drying)?: A Little Help from another person to put on and taking off regular upper body clothing?: A Little Help from another person to put on and taking off regular lower body clothing?: A Little 6 Click Score: 19   End of Session Equipment Utilized During Treatment: Gait belt;Rolling walker Nurse Communication: Mobility status  Activity Tolerance: Patient tolerated treatment well;No increased pain Patient left: in chair;with call bell/phone within reach;with chair alarm set;with nursing/sitter in room  OT Visit Diagnosis: Unsteadiness on feet (R26.81);Repeated falls (R29.6);Muscle weakness (generalized) (M62.81);History of falling (Z91.81)                Time: [id number] OT Time Calculation (min): 37 min Charges:  OT General Charges $OT Visit: 1 Visit OT Evaluation $OT Eval Moderate Complexity: 1 Mod OT Treatments $Self Care/Home Management : 8-22 mins   [place], [place]  03/06/2021, 4:11 PM

## 2021-03-06 NOTE — Progress Notes (Signed)
Spoke with daughter Lannette Donath. States patient was previously living by herself. Her daughters have made arrangements for her to stay with them after discharge. Lannette Donath asked if therapies could call her when they see her mother. Lannette Donath also requested an order for a rolling walker upon discharge if possible. Patient has been walking with a walker for 4 years, but her walker is damaged. Lannette Donath also reports patient has had multiple falls at home prior to admission.

## 2021-03-06 NOTE — Progress Notes (Signed)
Received a call from radiology regarding MRI results from last night. Radiology states they were unable to get in touch with Dr. Ralene Ok last night and asked if I could reach him. Dr. Kathyrn Sheriff paged. Call returned. Dr. Kathyrn Sheriff aware of MRI results.

## 2021-03-07 MED ORDER — LEVETIRACETAM 500 MG PO TABS: 500.0000 mg | ORAL_TABLET | Freq: Two times a day (BID) | ORAL | 0 refills | Status: DC

## 2021-03-07 MED ORDER — METHYLPREDNISOLONE 4 MG PO TBPK: ORAL_TABLET | ORAL | 0 refills | Status: DC

## 2021-03-07 NOTE — Evaluation (Addendum)
Physical Therapy Evaluation Patient Details Name: Kelli Shaffer MRN: [id number] DOB: 1941-03-13 Today's Date: 03/07/2021   History of Present Illness  80 y/o female presented to [facility] for R frontal craniotomy for resection of meningioma on 8/30 due MRI showing progressively worsening edema associated with meningioma. 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 PMH: frequent UTIs, HTN, left ventricular hypertrophy  Clinical Impression  PTA, patient was living alone but plan is to discharge home with daughter and her husband. Family will be able to provide 24 hour supervision/assistance at discharge. Patient currently requires min guard for safety for mobility with RW. Patient presents with generalized weakness, decreased activity tolerance, impaired balance, and impaired cognition (STM deficits at baseline). Patient will benefit from skilled PT services during acute stay to address listed deficits. Recommend HHPT following discharge to maximize functional mobility and safety.     Follow Up Recommendations Home health PT;Supervision/Assistance - 24 hour    Equipment Recommendations  Other (comment) (rollator)    Recommendations for Other Services       Precautions / Restrictions Precautions Precautions: Fall Restrictions Weight Bearing Restrictions: No      Mobility  Bed Mobility Overal bed mobility: Needs Assistance Bed Mobility: Supine to Sit     Supine to sit: Supervision     General bed mobility comments: supervision for safety, no physical assistance required    Transfers Overall transfer level: Needs assistance Equipment used: Rolling Anyjah Roundtree (2 wheeled) Transfers: Sit to/from Stand Sit to Stand: Min guard         General transfer comment: min guard from elevated surface and low recliner surface. Cues for hand placement  Ambulation/Gait Ambulation/Gait assistance: Min guard Gait Distance (Feet): 100 Feet Assistive  device: Rolling Janey Petron (2 wheeled) Gait Pattern/deviations: Step-through pattern;Decreased stride length;Trunk flexed Gait velocity: decreased   General Gait Details: min guard for safety. Cues throughout for close proximity to RW with intermittent follow through. Patient tends to push RW out too far in front  Stairs Stairs:  (simulated stair training with high marching with patient able to perform safely with no LOB and min guard)          Wheelchair Mobility    Modified Rankin (Stroke Patients Only) Modified Rankin (Stroke Patients Only) Pre-Morbid Rankin Score: Moderate disability Modified Rankin: Moderately severe disability     Balance Overall balance assessment: Needs assistance Sitting-balance support: No upper extremity supported;Feet supported Sitting balance-Leahy Scale: Fair     Standing balance support: No upper extremity supported Standing balance-Leahy Scale: Poor Standing balance comment: minA to maintain standing balance with no UE support to don mask                             Pertinent Vitals/Pain Pain Assessment: Faces Faces Pain Scale: Hurts a little bit Pain Location: R forehead Pain Descriptors / Indicators: Burning Pain Intervention(s): Monitored during session    Home Living Family/patient expects to be discharged to:: Private residence Living Arrangements: Children Available Help at Discharge: Family;Available 24 hours/day (previously lived alone, plan is to d/c to daughter's home with assistance from 2 adult daughters) Type of Home: House Home Access: Stairs to enter;Ramped entrance Entrance Stairs-Rails: Can reach both Entrance Stairs-Number of Steps: 2 Home Layout: One level Home Equipment: Reza Crymes - 4 wheels      Prior Function Level of Independence: Independent with assistive device(s)         Comments: Pt  lived alone in senior living apartment.  Pt with h/o multiple falls and would be on the ground for hours before  assist would arrive.  Plans to d/c to daughter's home with assistance from 2 daughters.     Hand Dominance        Extremity/Trunk Assessment   Upper Extremity Assessment Upper Extremity Assessment: Defer to OT evaluation    Lower Extremity Assessment Lower Extremity Assessment: Generalized weakness (grossly 4+/5 but functional demonstrates weakness)    Cervical / Trunk Assessment Cervical / Trunk Assessment: Kyphotic  Communication   Communication: No difficulties  Cognition Arousal/Alertness: Awake/alert Behavior During Therapy: WFL for tasks assessed/performed Overall Cognitive Status: History of cognitive impairments - at baseline                                 General Comments: daughter reports memory deficits and mild dementia. Following commands appropriately.      General Comments      Exercises     Assessment/Plan    PT Assessment Patient needs continued PT services  PT Problem List Decreased strength;Decreased activity tolerance;Decreased balance;Decreased cognition;Decreased safety awareness;Decreased knowledge of precautions       PT Treatment Interventions DME instruction;Gait training;Functional mobility training;Stair training;Therapeutic activities;Therapeutic exercise;Balance training;Patient/family education    PT Goals (Current goals can be found in the Care Plan section)  Acute Rehab PT Goals Patient Stated Goal: to go home with her daughters PT Goal Formulation: With patient Time For Goal Achievement: 03/21/21 Potential to Achieve Goals: Good    Frequency Min 3X/week   Barriers to discharge        Co-evaluation               AM-PAC PT "6 Clicks" Mobility  Outcome Measure Help needed turning from your back to your side while in a flat bed without using bedrails?: A Little Help needed moving from lying on your back to sitting on the side of a flat bed without using bedrails?: A Little Help needed moving to and from  a bed to a chair (including a wheelchair)?: A Little Help needed standing up from a chair using your arms (e.g., wheelchair or bedside chair)?: A Little Help needed to walk in hospital room?: A Little Help needed climbing 3-5 steps with a railing? : A Little 6 Click Score: 18    End of Session Equipment Utilized During Treatment: Gait belt Activity Tolerance: Patient tolerated treatment well Patient left: in chair;with call bell/phone within reach;with chair alarm set Nurse Communication: Mobility status PT Visit Diagnosis: Unsteadiness on feet (R26.81);Muscle weakness (generalized) (M62.81);History of falling (Z91.81)    Time: [id number] PT Time Calculation (min) (ACUTE ONLY): 24 min   Charges:   PT Evaluation $PT Eval Moderate Complexity: 1 Mod PT Treatments $Gait Training: 8-22 mins        Kaesha Kirsch A. Gilford Rile PT, DPT Acute Rehabilitation Services Pager [phone] Office [phone]   Linna Hoff 03/07/2021, 9:08 AM

## 2021-03-07 NOTE — TOC Initial Note (Addendum)
Transition of Care [facility]) - Initial/Assessment Note    Patient Details  Name: Kelli Shaffer MRN: [id number] Date of Birth: 1941/03/26  Transition of Care [facility]) CM/SW Contact:    Verdell Carmine, RN Phone Number: 03/07/2021, 10:24 AM  Clinical Narrative:                  22 [place] old patient admitted with meningioma excision. PT and OT evaluation reveal recommendation of Home health and DME. Reached out to patient via phone to discuss discharge planning, busy at this time. Will tentativly reach out to [facility] for availability of services. 1200 DME ordered, spoke to patient daughter Lannette Donath. She states  the patient is going to be living with another daughter the address is [address] [place], [place], [place] [address]. Discussed home health and they want to work with a agency that works well with her insurance.  Rfe-called [facility] for acceptance they havve sent to office and are awaiting a confirmation. In the meanwhile, did touch base with Levada Dy from [facility], however they cannot accept due to staffing in that area. Daughter stated that she asked the doctor for a letter sue to the fact the patient had to move and break her lease. Letter sent via email to patients RN to deliver to daughter that addressed her need for 24 hour supervision and assistance.   1330: daughter priscilla called this CM to say that they were trying to give her the incorrect walker, she wants a 4 wheeled walker. This CM states that orders are made upon recommendations of PT and will have to be consulted for any changes, as they recommend the safest DME for the patients mobility requirements. She stated that they were just in working with the patient and that they had discussed it. Messaged PT and OT for updating recommendations. [facility] aware of posible changes.  1340 PT and OT messaged, an addendum was added for 4 wheeled walker, ordered via [facility].  Home health secured for PT OT via [facility], daughter called to let her know. Let HH  know of address change from face sheet. Messaged RN to let her know all was secured and AVS can be printed.   Expected Discharge Plan: [place] Barriers to Discharge: Continued Medical Work up   Patient Goals and CMS Choice        Expected Discharge Plan and Services Expected Discharge Plan: [place]   Discharge Planning Services: CM Consult Post Acute Care Choice: [place] arrangements for the past 2 months: Apartment                                      Prior Living Arrangements/Services Living arrangements for the past 2 months: Apartment Lives with:: Self Patient language and need for interpreter reviewed:: Yes        Need for Family Participation in Patient Care: Yes (Comment) Care giver support system in place?: Yes (comment)   Criminal Activity/Legal Involvement Pertinent to Current Situation/Hospitalization: No - Comment as needed  Activities of Daily Living Home Assistive Devices/Equipment: Cane (specify quad or straight), Walker (specify type), Dentures (specify type), Eyeglasses, Blood pressure cuff, Shower chair with back, Hand-held shower hose, Grab bars around toilet, Grab bars in shower ADL Screening (condition at time of admission) Patient's cognitive ability adequate to safely complete daily activities?: Yes Is the patient deaf or have difficulty hearing?: Yes Does  the patient have difficulty seeing, even when wearing glasses/contacts?: No Does the patient have difficulty concentrating, remembering, or making decisions?: Yes Patient able to express need for assistance with ADLs?: Yes Does the patient have difficulty dressing or bathing?: No Independently performs ADLs?: Yes (appropriate for developmental age) Does the patient have difficulty walking or climbing stairs?: Yes Weakness of Legs: Both Weakness of Arms/Hands: None  Permission Sought/Granted                  Emotional Assessment          Alcohol / Substance Use: Not Applicable Psych Involvement: No (comment)  Admission diagnosis:  Meningioma [facility]) [D32.9] Patient Active Problem List   Diagnosis Date Noted   Meningioma ([place]) 03/05/2021   PCP:  Ebbie Ridge, MD Pharmacy:   [facility] [place], [address] AT [address] [place] [address] Phone: [phone] Fax: [phone]     Social Determinants of Health (SDOH) Interventions    Readmission Risk Interventions No flowsheet data found.

## 2021-03-07 NOTE — Discharge Summary (Addendum)
Physician Discharge Summary  Patient ID: Kelli Shaffer MRN: [id number] DOB/AGE: 11/28/1940 80 y.o.  Admit date: 03/05/2021 Discharge date: 03/07/2021  Admission Diagnoses:  Meningioma Cerebral edema   Discharge Diagnoses:  Same Active Problems:   Meningioma [facility])  Cerebral edema associated with above  Discharged Condition: Stable  Hospital Course:  Kelli Shaffer is a 80 y.o. female admitted after elective craniotomy for resection of meningioma. She was at neurologic baseline postop and monitored in the ICU. Her postop MRI demonstrated good resection with multiple small punctate infarcts without any clinical sequelae. She was ambulating well, voiding, tolerating diet, with minimal HA and requested discharge home.  Treatments: Surgery - right Craniotomy for resection of meningioma  Discharge Exam: Blood pressure (!) 147/71, pulse (!) 54, temperature 98.7 F (37.1 C), temperature source Oral, resp. rate 17, height '5\' 1"'$  (1.549 m), weight 86.6 kg, SpO2 96 %. Awake, alert, oriented Speech fluent, appropriate CN grossly intact 5/5 BUE/BLE Wound c/d/i  Disposition: Discharge disposition: 01-Home or Self Care      Discharge Instructions     Call MD for:  redness, tenderness, or signs of infection (pain, swelling, redness, odor or green/yellow discharge around incision site)   Complete by: As directed    Call MD for:  temperature >100.4   Complete by: As directed    Diet - low sodium heart healthy   Complete by: As directed    Discharge instructions   Complete by: As directed    Walk at home as much as possible, at least 4 times / day   Face-to-face encounter (required for Medicare/Medicaid patients)   Complete by: As directed    I Jairo Ben certify that this patient is under my care and that I, or a nurse practitioner or physician's assistant working with me, had a face-to-face encounter that meets the physician face-to-face encounter requirements with this patient on  03/07/2021. The encounter with the patient was in whole, or in part for the following medical condition(s) which is the primary reason for home health care (List medical condition): Meningioma, s/p surgery   The encounter with the patient was in whole, or in part, for the following medical condition, which is the primary reason for home health care: meningioma   I certify that, based on my findings, the following services are medically necessary home health services: Physical therapy   Reason for Medically Necessary Home Health Services: Therapy- Home [facility] to Facilitate Safety   My clinical findings support the need for the above services: Unable to leave home safely without assistance and/or assistive device   Further, I certify that my clinical findings support that this patient is homebound due to: Ambulates short distances less than 300 feet   Home Health   Complete by: As directed    To provide the following care/treatments:  PT OT     Increase activity slowly   Complete by: As directed    Lifting restrictions   Complete by: As directed    No lifting > 10 lbs   May shower / Bathe   Complete by: As directed    48 hours after surgery   May walk up steps   Complete by: As directed    No dressing needed   Complete by: As directed    Other Restrictions   Complete by: As directed    No bending/twisting at waist      Allergies as of 03/07/2021       Reactions   Lactase Other (See  Comments), Cough   Congestion, also   Penicillins Rash        Medication List     TAKE these medications    acetaminophen 500 MG tablet Commonly known as: TYLENOL Take 1,000 mg by mouth daily as needed (for pain).   carvedilol 25 MG tablet Commonly known as: COREG Take 25 mg by mouth 2 (two) times daily with a meal.   fluticasone 50 MCG/ACT nasal spray Commonly known as: FLONASE Place 1-2 sprays into both nostrils daily.   hydrochlorothiazide 25 MG tablet Commonly known as:  HYDRODIURIL Take 25 mg by mouth daily.   levETIRAcetam 500 MG tablet Commonly known as: Keppra Take 1 tablet (500 mg total) by mouth 2 (two) times daily.   loratadine 10 MG tablet Commonly known as: CLARITIN Take 10 mg by mouth daily.   methylPREDNISolone 4 MG Tbpk tablet Commonly known as: MEDROL DOSEPAK Take tapering dose as directed on package   One-A-Day Proactive 65+ Tabs Take 1 tablet by mouth daily with breakfast.   spironolactone 50 MG tablet Commonly known as: ALDACTONE Take 50 mg by mouth daily.   traMADol 50 MG tablet Commonly known as: ULTRAM Take 25 mg by mouth every 8 (eight) hours as needed (for pain).               Durable Medical Equipment  (From admission, onward)           Start     Ordered   03/07/21 1346  For home use only DME 4 wheeled rolling walker with seat  Once       Question:  Patient needs a walker to treat with the following condition  Answer:  Weakness   03/07/21 1346   03/07/21 1314  For home use only DME Tub bench  Once        03/07/21 1314   03/07/21 1156  For home use only DME 3 n 1  Once        03/07/21 1156              Discharge Care Instructions  (From admission, onward)           Start     Ordered   03/07/21 0000  No dressing needed        03/07/21 1105            Follow-up Information     Consuella Lose, MD Follow up in 2 week(s).   Specialty: Neurosurgery Contact information: [address] [place] [address] [place], [place] [facility] Follow up.   Why: your medical equipment Contact information: [address] [place] [address] [phone]         [facility] Follow up.   Why: your home health provider they will call 24-48 hours from discharge to set up services. When they come evaluate her, that is when they and you will decide how many visits a week they will make.                SignedJairo Ben 03/07/2021, 2:09 PM

## 2021-03-07 NOTE — TOC Transition Note (Signed)
Transition of Care [facility]) - CM/SW Discharge Note   Patient Details  Name: Kelli Shaffer MRN: [id number] Date of Birth: 03/24/1941  Transition of Care [facility]) CM/SW Contact:  Verdell Carmine, RN Phone Number: 03/07/2021, 2:00 PM   Clinical Narrative:    All questions answered for patient and daughter ready for DC as soon as DME arrives     Barriers to Discharge: Continued Medical Work up   Patient Goals and CMS Choice        Discharge Placement               Home with [place] and DME        Discharge Plan and Services   Discharge Planning Services: CM Consult Post Acute Care Choice: Home Health          DME Arranged: Walker rolling with seat   Date DME Agency Contacted: 03/07/21 Time DME Agency Contacted: [id number] Representative spoke with at DME Agency: [place]: PT, OT HH Agency: [place] Date [place]: 03/07/21 Time [place]: 1200 Representative spoke with at [place]: Amy  Social Determinants of Health ([place]) Interventions     Readmission Risk Interventions No flowsheet data found.

## 2021-03-07 NOTE — Progress Notes (Signed)
  NEUROSURGERY PROGRESS NOTE   No issues overnight. Minimal HA. No real complaints. Tolerating diet, voiding, ambulating well.  EXAM:  BP (!) 147/73   Pulse (!) 54   Temp 98.6 F (37 C) (Oral)   Resp 18   Ht '5\' 1"'$  (1.549 m)   Wt 86.6 kg   SpO2 98%   BMI 36.09 kg/m   Awake, alert, oriented  Speech fluent, appropriate  CN grossly intact  5/5 BUE/BLE   IMPRESSION:  80 y.o. female s/p resection of right frontal meningioma, doing well  PLAN: - d/c home today - cont prophylactic keppra - f/u in office in 2 weeks   Consuella Lose, MD [facility]

## 2021-03-08 LAB — SURGICAL PATHOLOGY

## 2021-03-18 ENCOUNTER — Inpatient Hospital Stay: Payer: Medicare Other | Attending: Neurosurgery

## 2021-04-03 ENCOUNTER — Other Ambulatory Visit: Payer: Self-pay | Admitting: Infectious Diseases

## 2021-04-03 DIAGNOSIS — N1832 Chronic kidney disease, stage 3b: Secondary | ICD-10-CM

## 2021-04-16 DIAGNOSIS — R4701 Aphasia: Secondary | ICD-10-CM | POA: Insufficient documentation

## 2021-04-16 DIAGNOSIS — R413 Other amnesia: Secondary | ICD-10-CM | POA: Insufficient documentation

## 2021-04-16 DIAGNOSIS — R262 Difficulty in walking, not elsewhere classified: Secondary | ICD-10-CM | POA: Insufficient documentation

## 2021-04-16 DIAGNOSIS — R2689 Other abnormalities of gait and mobility: Secondary | ICD-10-CM | POA: Insufficient documentation

## 2021-04-16 DIAGNOSIS — Z86018 Personal history of other benign neoplasm: Secondary | ICD-10-CM | POA: Insufficient documentation

## 2021-04-22 ENCOUNTER — Ambulatory Visit
Admission: RE | Admit: 2021-04-22 | Discharge: 2021-04-22 | Disposition: A | Discharge status: Home / Self Care | Payer: Medicare Other | Source: Ambulatory Visit | Attending: Infectious Diseases | Admitting: Infectious Diseases

## 2021-04-22 DIAGNOSIS — N1832 Chronic kidney disease, stage 3b: Secondary | ICD-10-CM | POA: Diagnosis not present

## 2021-04-29 ENCOUNTER — Other Ambulatory Visit: Payer: Self-pay

## 2021-04-29 ENCOUNTER — Encounter: Payer: Self-pay | Admitting: Urology

## 2021-04-29 ENCOUNTER — Ambulatory Visit (INDEPENDENT_AMBULATORY_CARE_PROVIDER_SITE_OTHER): Payer: Medicare Other | Admitting: Urology

## 2021-04-29 VITALS — BP 129/72 | HR 60 | Wt 180.0 lb

## 2021-04-29 DIAGNOSIS — N3281 Overactive bladder: Secondary | ICD-10-CM | POA: Diagnosis not present

## 2021-04-29 DIAGNOSIS — N1330 Unspecified hydronephrosis: Secondary | ICD-10-CM | POA: Diagnosis not present

## 2021-04-29 MED ORDER — MIRABEGRON ER 25 MG PO TB24: 25.0000 mg | ORAL_TABLET | Freq: Every day | ORAL | 0 refills | Status: DC

## 2021-04-29 NOTE — Progress Notes (Signed)
   04/29/21 3:07 PM   Celestina [place] Deck 1941/01/26 [id number]  CC: Right hydronephrosis, overactive bladder  HPI: 80 year old female with CKD and recent creatinine of 1.5(eGFR 40) who had an ultrasound that showed mild right-sided hydronephrosis.  Prior imaging with CT in 2020 showed no urologic abnormalities.  She denies any flank pain or gross hematuria.  Urinalysis today is completely benign, PVR is normal.  She reports overactive bladder symptoms of urgency, frequency, and urge incontinence.  She has never tried medications for this.  She drinks primarily water during the day.    PMH: Past Medical History:  Diagnosis Date   Frequent UTI    Hypertension    LVH (left ventricular hypertrophy)    Meningioma (HCC)    Osteoarthritis     Surgical History: Past Surgical History:  Procedure Laterality Date   APPLICATION OF CRANIAL NAVIGATION Right 03/05/2021   Procedure: APPLICATION OF CRANIAL NAVIGATION;  Surgeon: Consuella Lose, MD;  Location: [facility];  Service: Neurosurgery;  Laterality: Right;   CESAREAN SECTION     CRANIOTOMY Right 03/05/2021   Procedure: STEREOTACTIC RIGHT FRONTAL CRANIOTOMY FOR RESECTION OF MENINGIOMA;  Surgeon: Consuella Lose, MD;  Location: [facility];  Service: Neurosurgery;  Laterality: Right;   HAND SURGERY Left    thumb    Family History: History reviewed. No pertinent family history.  Social History:  reports that she has never smoked. She has never used smokeless tobacco. She reports that she does not drink alcohol and does not use drugs.  Physical Exam: BP 129/72   Pulse 60   Wt 180 lb (81.6 kg) Comment: Patient reports  BMI 34.01 kg/m    Constitutional:  Alert and oriented, No acute distress. Cardiovascular: No clubbing, cyanosis, or edema. Respiratory: Normal respiratory effort, no increased work of breathing. GI: Abdomen is soft, nontender, nondistended, no abdominal masses   Pertinent Imaging: I have personally viewed and interpreted the  renal ultrasound showing mild right-sided hydronephrosis of unknown etiology.  Assessment & Plan:   80 year old comorbid female with CKD and renal ultrasound showing mild right hydronephrosis of unclear etiology.  I recommended a CT stone protocol for better evaluation, and possible etiologies were discussed at length.  She also has overactive bladder symptoms, and we reviewed behavioral strategies, as well as a trial of Myrbetriq, and risks and benefits discussed  RTC for symptom check on Myrbetriq and to discuss CT results  Nickolas Madrid, MD 04/29/2021  [place] [address], [place], [place] [address] [phone]

## 2021-04-30 LAB — URINALYSIS, COMPLETE
Bilirubin, UA: NEGATIVE
Glucose, UA: NEGATIVE
Leukocytes,UA: NEGATIVE
Nitrite, UA: NEGATIVE
Protein,UA: NEGATIVE
RBC, UA: NEGATIVE
Specific Gravity, UA: 1.02 (ref 1.005–1.030)
Urobilinogen, Ur: 0.2 mg/dL (ref 0.2–1.0)
pH, UA: 5.5 (ref 5.0–7.5)

## 2021-04-30 LAB — MICROSCOPIC EXAMINATION: Epithelial Cells (non renal): >10 /hpf — AB (ref 0–10)

## 2021-05-21 ENCOUNTER — Other Ambulatory Visit: Payer: Self-pay

## 2021-05-21 ENCOUNTER — Ambulatory Visit
Admission: RE | Admit: 2021-05-21 | Discharge: 2021-05-21 | Disposition: A | Discharge status: Home / Self Care | Payer: Medicare Other | Source: Ambulatory Visit | Attending: Urology | Admitting: Urology

## 2021-05-21 DIAGNOSIS — N1330 Unspecified hydronephrosis: Secondary | ICD-10-CM | POA: Diagnosis not present

## 2021-05-22 ENCOUNTER — Ambulatory Visit: Payer: Medicare Other | Admitting: Urology

## 2021-05-23 ENCOUNTER — Other Ambulatory Visit: Payer: Self-pay

## 2021-05-23 ENCOUNTER — Encounter: Payer: Self-pay | Admitting: Urology

## 2021-05-23 ENCOUNTER — Ambulatory Visit: Payer: Medicare Other | Admitting: Urology

## 2021-05-23 VITALS — BP 136/80 | HR 72 | Ht 58.5 in | Wt 188.7 lb

## 2021-05-23 DIAGNOSIS — N3281 Overactive bladder: Secondary | ICD-10-CM

## 2021-05-23 DIAGNOSIS — N1832 Chronic kidney disease, stage 3b: Secondary | ICD-10-CM

## 2021-05-23 MED ORDER — MIRABEGRON ER 25 MG PO TB24: 25.0000 mg | ORAL_TABLET | Freq: Every day | ORAL | 2 refills | Status: DC

## 2021-05-23 MED ORDER — MIRABEGRON ER 25 MG PO TB24: 25.0000 mg | ORAL_TABLET | Freq: Every day | ORAL | 0 refills | Status: DC

## 2021-05-23 NOTE — Progress Notes (Signed)
   05/23/2021 3:21 PM   Kelli Shaffer [birth year] [id number]  Reason for visit: Follow up hydronephrosis, overactive bladder, CKD  HPI: 80 year old female with CKD who had a renal ultrasound that showed possible mild right-sided hydronephrosis as well as overactive bladder symptoms and was referred to me.  I ordered a CT stone protocol, and personally viewed and interpreted those images dated 04/22/2021 that shows no evidence of hydronephrosis and normal-appearing bladder.  Reassurance was provided, and basic renal preservation strategies discussed.  She was also having overactive bladder symptoms of urgency, frequency, and urge incontinence.  She drinks primarily water during the day.  We trialed Myrbetriq 25 mg daily samples, and she is noticed significant improvement in the urination on this medication and would like to continue.  -Continue Myrbetriq 25 mg daily -No further imaging from a urology perspective, no hydronephrosis on CT -RTC PA 6 months symptom check regarding [place], MD  [place] [address], [place], [place] [address] [phone]

## 2021-05-27 ENCOUNTER — Telehealth: Payer: Self-pay | Admitting: Urology

## 2021-05-27 DIAGNOSIS — N3281 Overactive bladder: Secondary | ICD-10-CM

## 2021-05-27 MED ORDER — TROSPIUM CHLORIDE ER 60 MG PO CP24: 60.0000 mg | ORAL_CAPSULE | Freq: Every day | ORAL | 0 refills | Status: DC

## 2021-05-27 NOTE — Telephone Encounter (Signed)
Pts daughter called in stating that the Myrbetriq is too expensive with the card (she said $47 is too much) and she needs Dr. Diamantina Providence to call in something less expensive. She would like a call back today if possible.

## 2021-05-27 NOTE — Addendum Note (Signed)
Addended by: Donalee Citrin on: 05/27/2021 01:31 PM   Modules accepted: Orders

## 2021-05-27 NOTE — Telephone Encounter (Signed)
Please advise 

## 2021-05-27 NOTE — Telephone Encounter (Signed)
Other options would be the vibegron 75mg  daily through the [facility] pharmacy, or trospium 20mg  BID or trospium 60mg  XL   Nickolas Madrid, MD  05/27/2021   Called pt's daughter per DPR. Informed her of alternative medication send in. Advised daughter that it is highly unlikely that any medication in this class will be less than the current co-pay of $47. Daughter gave verbal understanding.   RX sent.

## 2021-05-29 NOTE — Telephone Encounter (Signed)
Incoming fax from pharmacy stating that they will not cover Trospium. Called pt's daughter informed her that they will need to continue Myrbetriq as that is the preferred drug on the patient's plan. Daughter voiced understanding.

## 2021-09-26 ENCOUNTER — Encounter (INDEPENDENT_AMBULATORY_CARE_PROVIDER_SITE_OTHER): Payer: Medicare Other | Admitting: Vascular Surgery

## 2021-11-14 ENCOUNTER — Ambulatory Visit (INDEPENDENT_AMBULATORY_CARE_PROVIDER_SITE_OTHER): Payer: Medicare Other | Admitting: Vascular Surgery

## 2021-11-14 ENCOUNTER — Encounter (INDEPENDENT_AMBULATORY_CARE_PROVIDER_SITE_OTHER): Payer: Self-pay | Admitting: Vascular Surgery

## 2021-11-14 DIAGNOSIS — I10 Essential (primary) hypertension: Secondary | ICD-10-CM

## 2021-11-14 DIAGNOSIS — K219 Gastro-esophageal reflux disease without esophagitis: Secondary | ICD-10-CM

## 2021-11-14 DIAGNOSIS — M47816 Spondylosis without myelopathy or radiculopathy, lumbar region: Secondary | ICD-10-CM

## 2021-11-14 DIAGNOSIS — I890 Lymphedema, not elsewhere classified: Secondary | ICD-10-CM

## 2021-11-14 NOTE — Progress Notes (Signed)
? ? ? ? ?MRN : [id number] ? ?Kelli Shaffer is a 81 y.o. (March 30, 1941) female who presents with chief complaint of legs swell. ? ?History of Present Illness:  ? ?Patient is seen for evaluation of leg swelling. The patient first noticed the swelling remotely but is now concerned because of a significant increase in the overall edema. The swelling isn't associated with significant pain.  There has been an increasing amount of  discoloration noted by the patient. The patient notes that in the morning the legs are improved but they steadily worsened throughout the course of the day. Elevation seems to make the swelling of the legs better, dependency makes them much worse.  ? ?There is no history of ulcerations associated with the swelling.  ? ?The patient denies any recent changes in their medications. ? ?The patient has been wearing graduated compression repeatedly in the past but this doesn't seem to help. ? ?The patient has no had any past angiography, interventions or vascular surgery. ? ?The patient denies a history of DVT or PE. There is no prior history of phlebitis. ?There is no history of primary lymphedema. ? ?There is no history of radiation treatment to the groin or pelvis ?No history of malignancies. ?No history of trauma or groin or pelvic surgery. ?No history of foreign travel or parasitic infections area   ? ?No outpatient medications have been marked as taking for the 11/14/21 encounter (Appointment) with Delana Meyer, Dolores Lory, MD.  ? ? ?Past Medical History:  ?Diagnosis Date  ? Frequent UTI   ? Hypertension   ? LVH (left ventricular hypertrophy)   ? 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 Osteoarthritis   ? ? ?Past Surgical History:  ?Procedure Laterality Date  ? APPLICATION OF CRANIAL NAVIGATION Right 03/05/2021  ? Procedure: APPLICATION OF CRANIAL NAVIGATION;  Surgeon: Consuella Lose, MD;  Location: [facility];  Service: Neurosurgery;  Laterality: Right;  ? CESAREAN SECTION    ? CRANIOTOMY Right 03/05/2021  ? Procedure:  STEREOTACTIC RIGHT FRONTAL CRANIOTOMY FOR RESECTION OF MENINGIOMA;  Surgeon: Consuella Lose, MD;  Location: [facility];  Service: Neurosurgery;  Laterality: Right;  ? HAND SURGERY Left   ? thumb  ? ? ?Social History ?Social History  ? ?Tobacco Use  ? Smoking status: Never  ? Smokeless tobacco: Never  ?Vaping Use  ? Vaping Use: Never used  ?Substance Use Topics  ? Alcohol use: Never  ? Drug use: Never  ? ? ?Family History ?No family history on file. ? ?Allergies  ?Allergen Reactions  ? Tilactase Other (See Comments) and Cough  ?  Congestion, also  ? Penicillins Rash  ? ? ? ?REVIEW OF SYSTEMS (Negative unless checked) ? ?Constitutional: '[]'$ Weight loss  '[]'$ Fever  '[]'$ Chills ?Cardiac: '[]'$ Chest pain   '[]'$ Chest pressure   '[]'$ Palpitations   '[]'$ Shortness of breath when laying flat   '[]'$ Shortness of breath with exertion. ?Vascular:  '[]'$ Pain in legs with walking   '[x]'$ Pain in legs with standing  '[]'$ History of DVT   '[]'$ Phlebitis   '[x]'$ Swelling in legs   '[]'$ Varicose veins   '[]'$ Non-healing ulcers ?Pulmonary:   '[]'$ Uses home oxygen   '[]'$ Productive cough   '[]'$ Hemoptysis   '[]'$ Wheeze  '[]'$ COPD   '[]'$ Asthma ?Neurologic:  '[]'$ Dizziness   '[]'$ Seizures   '[]'$ History of stroke   '[]'$ History of TIA  '[]'$ Aphasia   '[]'$ Vissual changes   '[]'$ Weakness or numbness in arm   '[]'$ Weakness or numbness in leg ?Musculoskeletal:   '[]'$ Joint swelling   '[x]'$ Joint pain   '[]'$ Low back pain ?Hematologic:  '[]'$ Easy bruising  '[]'$   Easy bleeding   '[]'$ Hypercoagulable state   '[]'$ Anemic ?Gastrointestinal:  '[]'$ Diarrhea   '[]'$ Vomiting  '[x]'$ Gastroesophageal reflux/heartburn   '[]'$ Difficulty swallowing. ?Genitourinary:  '[]'$ Chronic kidney disease   '[]'$ Difficult urination  '[]'$ Frequent urination   '[]'$ Blood in urine ?Skin:  '[]'$ Rashes   '[]'$ Ulcers  ?Psychological:  '[]'$ History of anxiety   '[]'$  History of major depression. ? ?Physical Examination ? ?There were no vitals filed for this visit. ?There is no height or weight on file to calculate BMI. ?Gen: WD/WN, NAD ?Head: /AT, No temporalis wasting.  ?Ear/Nose/Throat: Hearing grossly  intact, nares w/o erythema or drainage, pinna without lesions ?Eyes: PER, EOMI, sclera nonicteric.  ?Neck: Supple, no gross masses.  No JVD.  ?Pulmonary:  Good air movement, no audible wheezing, no use of accessory muscles.  ?Cardiac: RRR, precordium not hyperdynamic. ?Vascular:  scattered varicosities present bilaterally.  Mild venous stasis changes to the legs bilaterally.  3-4+ soft pitting edema  ?Vessel Right Left  ?Radial Palpable Palpable  ?Gastrointestinal: soft, non-distended. No guarding/no peritoneal signs.  ?Musculoskeletal: M/S 5/5 throughout.  No deformity.  ?Neurologic: CN 2-12 intact. Pain and light touch intact in extremities.  Symmetrical.  Speech is fluent. Motor exam as listed above. ?Psychiatric: Judgment intact, Mood & affect appropriate for pt's clinical situation. ?Dermatologic: Venous rashes no ulcers noted.  No changes consistent with cellulitis. ?Lymph : No lichenification or skin changes of chronic lymphedema. ? ?CBC ?Lab Results  ?Component Value Date  ? WBC 5.9 02/26/2021  ? HGB 9.5 (L) 03/05/2021  ? HCT 28.0 (L) 03/05/2021  ? MCV 99.7 02/26/2021  ? PLT 173 02/26/2021  ? ? ?BMET ?   ?Component Value Date/Time  ? NA 131 (L) 03/05/2021 0839  ? K 3.9 03/05/2021 0839  ? CL 108 02/26/2021 1528  ? CO2 26 02/26/2021 1528  ? GLUCOSE 122 (H) 02/26/2021 1528  ? BUN 26 (H) 02/26/2021 1528  ? CREATININE 1.66 (H) 02/26/2021 1528  ? CALCIUM 9.9 02/26/2021 1528  ? GFRNONAA 31 (L) 02/26/2021 1528  ? ?CrCl cannot be calculated (Patient's most recent lab result is older than the maximum 21 days allowed.). ? ?COAG ?No results found for: INR, PROTIME ? ?Radiology ?No results found. ? ? ?Assessment/Plan ?1. Lymphedema ?Recommend: ? ?No surgery or intervention at this point in time.   ? ?I have reviewed my discussion with the patient regarding lymphedema and why it  causes symptoms.  Patient will continue wearing graduated compression on a daily basis. The patient should put the compression on first thing in  the morning and removing them in the evening. The patient should not sleep in the compression.  ? ?In addition, behavioral modification throughout the day will be continued.  This will include frequent elevation (such as in a recliner), use of over the counter pain medications as needed and exercise such as walking. ? ?The systemic causes for chronic edema such as liver, kidney and cardiac etiologies do not appear to have significant changed over the past year.   ? ?Despite conservative treatments including graduated compression therapy class 1 and behavioral modification including exercise and elevation the patient  has not obtained adequate control of the lymphedema.  The patient still has stage 3 lymphedema and therefore, I believe that a lymph pump should be added to improve the control of the patient's lymphedema.  Additionally, a lymph pump is warranted because it will reduce the risk of cellulitis and ulceration in the future. ? ?Patient should follow-up in six months   ? ?2. Gastroesophageal reflux disease, unspecified  whether esophagitis present ?Continue PPI as already ordered, this medication has been reviewed and there are no changes at this time. ? ?Avoidence of caffeine and alcohol ? ?Moderate elevation of the head of the bed   ? ?3. Osteoarthritis of lumbar spine, unspecified spinal osteoarthritis complication status ?Continue NSAID medications as already ordered, these medications have been reviewed and there are no changes at this time. ? ?Continued activity and therapy was stressed.  ? ?4. Essential hypertension ?Continue antihypertensive medications as already ordered, these medications have been reviewed and there are no changes at this time.  ? ? ? ?Hortencia Pilar, MD ? ?11/14/2021 ?1:12 PM ? ?  ?

## 2021-11-16 ENCOUNTER — Encounter (INDEPENDENT_AMBULATORY_CARE_PROVIDER_SITE_OTHER): Payer: Self-pay | Admitting: Vascular Surgery

## 2021-11-19 NOTE — Progress Notes (Signed)
Error

## 2021-11-20 ENCOUNTER — Encounter: Payer: Self-pay | Admitting: Urology

## 2021-11-20 ENCOUNTER — Ambulatory Visit: Payer: Medicare Other | Admitting: Urology

## 2021-11-20 ENCOUNTER — Ambulatory Visit (INDEPENDENT_AMBULATORY_CARE_PROVIDER_SITE_OTHER): Payer: Medicare Other | Admitting: Urology

## 2021-11-20 VITALS — BP 113/71 | HR 68 | Ht 61.0 in | Wt 187.0 lb

## 2021-11-20 DIAGNOSIS — N3281 Overactive bladder: Secondary | ICD-10-CM

## 2021-11-20 DIAGNOSIS — N3944 Nocturnal enuresis: Secondary | ICD-10-CM

## 2021-11-20 LAB — BLADDER SCAN AMB NON-IMAGING

## 2021-11-20 NOTE — Progress Notes (Signed)
? ? ?11/20/2021 ?12:09 PM  ? ?Kelli Shaffer ?August 04, 1940 ?[id number] ? ?Referring provider: Ebbie Ridge, MD ?[facility] ?[address] ?[place],  [place] [address] ? ?Urological history: ?1. OAB ?-contributing factors of age, vaginal atrophy, HTN, arthritis, lumbar DDD, lymphedema and dementia ?-PVR 13 mL ?-Myrbetriq 25 mg daily  ? ?Chief Complaint  ?Patient presents with  ? Over Active Bladder  ? ? ?HPI: ?Kelli Shaffer is a 81 y.o. female who presents today for 6 month follow up with her daughter, Lannette Donath.   ? ?She has circled frequency, urgency, leakage of urine and backslash flank pain on her review of systems. ? ?On her OAB questionnaire, she makes 1-7 trips during the day.  She experiences nocturnal enuresis.  She has leaking with both urge and stress.  She leaks 3 or more times daily.  She wears 3 absorbent underwear's daily.  She limits fluid on occasions.  She does engage in toilet mapping. ? ?PVR 13 mL ? ?She has noted some improvement with the Myrbetriq 25 mg daily.  She drinking only water during day.   She is on diuretics and daughter states that she is lost a significant amount of water weight over the last several weeks as well. ? ?PMH: ?Past Medical History:  ?Diagnosis Date  ? Frequent UTI   ? Hypertension   ? LVH (left ventricular hypertrophy)   ? 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 Osteoarthritis   ? ? ?Surgical History: ?Past Surgical History:  ?Procedure Laterality Date  ? APPLICATION OF CRANIAL NAVIGATION Right 03/05/2021  ? Procedure: APPLICATION OF CRANIAL NAVIGATION;  Surgeon: Consuella Lose, MD;  Location: [facility];  Service: Neurosurgery;  Laterality: Right;  ? CESAREAN SECTION    ? CRANIOTOMY Right 03/05/2021  ? Procedure: STEREOTACTIC RIGHT FRONTAL CRANIOTOMY FOR RESECTION OF MENINGIOMA;  Surgeon: Consuella Lose, MD;  Location: [facility];  Service: Neurosurgery;  Laterality: Right;  ? HAND SURGERY Left   ? thumb  ? ? ?Home Medications:  ?Allergies as of 11/20/2021   ? ?   Reactions  ? Tilactase Other (See  Comments), Cough  ? Congestion, also  ? Penicillins Rash  ? ?  ? ?  ?Medication List  ?  ? ?  ? Accurate as of Nov 20, 2021 12:09 PM. If you have any questions, ask your nurse or doctor.  ?  ?  ? ?  ? ?acetaminophen 500 MG tablet ?Commonly known as: TYLENOL ?Take 1,000 mg by mouth daily as needed (for pain). ?  ?carvedilol 25 MG tablet ?Commonly known as: COREG ?Take 25 mg by mouth 2 (two) times daily with a meal. ?  ?donepezil 5 MG tablet ?Commonly known as: ARICEPT ?Take 5 mg by mouth daily. ?  ?fluticasone 50 MCG/ACT nasal spray ?Commonly known as: FLONASE ?Place 1-2 sprays into both nostrils daily. ?  ?hydrochlorothiazide 25 MG tablet ?Commonly known as: HYDRODIURIL ?Take 25 mg by mouth daily. ?  ?loratadine 10 MG tablet ?Commonly known as: CLARITIN ?Take 10 mg by mouth daily. ?  ?Myrbetriq 25 MG Tb24 tablet ?Generic drug: mirabegron ER ?Take 25 mg by mouth daily. ?  ?One-A-Day Proactive 65+ Tabs ?Take 1 tablet by mouth daily with breakfast. ?  ?spironolactone 50 MG tablet ?Commonly known as: ALDACTONE ?Take 50 mg by mouth daily. ?  ?traMADol 50 MG tablet ?Commonly known as: ULTRAM ?Take 25 mg by mouth every 8 (eight) hours as needed (for pain). ?  ?Trospium Chloride 60 MG Cp24 ?Take 1 capsule (60 mg total) by mouth  daily. ?  ? ?  ? ? ?Allergies:  ?Allergies  ?Allergen Reactions  ? Tilactase Other (See Comments) and Cough  ?  Congestion, also  ? Penicillins Rash  ? ? ?Family History: ?Family History  ?Problem Relation Age of Onset  ? Hypertension Mother   ? Arthritis Father   ? Diabetes Daughter   ? Arthritis Daughter   ? ? ?Social History:  reports that she has never smoked. She has never used smokeless tobacco. She reports that she does not drink alcohol and does not use drugs. ? ?ROS: ?Pertinent ROS in HPI ? ?Physical Exam: ?BP 113/71   Pulse 68   Ht '5\' 1"'  (1.549 m)   Wt 187 lb (84.8 kg)   BMI 35.33 kg/m?   ?Constitutional:  Well nourished. Alert and oriented, No acute distress. ?HEENT:  AT, moist mucus  membranes.  Trachea midline, no masses. ?Cardiovascular: No clubbing, cyanosis, or edema. ?Respiratory: Normal respiratory effort, no increased work of breathing. ?Neurologic: Grossly intact, no focal deficits, moving all 4 extremities. ?Psychiatric: Normal mood and affect.   ? ?Laboratory Data: ?WBC (White Blood Cell Count) 4.1 - 10.2 10?3/uL 5.3   ?RBC (Red Blood Cell Count) 4.04 - 5.48 10?6/uL 3.78 Low    ?Hemoglobin 12.0 - 15.0 gm/dL 11.7 Low    ?Hematocrit 35.0 - 47.0 % 36.4   ?MCV (Mean Corpuscular Volume) 80.0 - 100.0 fl 96.3   ?MCH (Mean Corpuscular Hemoglobin) 27.0 - 31.2 pg 31.0   ?MCHC (Mean Corpuscular Hemoglobin Concentration) 32.0 - 36.0 gm/dL 32.1   ?Platelet Count 150 - 450 10?3/uL 183   ?RDW-CV (Red Cell Distribution Width) 11.6 - 14.8 % 14.6   ?MPV (Mean Platelet Volume) 9.4 - 12.4 fl 10.7   ?Neutrophils 1.50 - 7.80 10?3/uL 2.26   ?Lymphocytes 1.00 - 3.60 10?3/uL 2.22   ?Monocytes 0.00 - 1.50 10?3/uL 0.64   ?Eosinophils 0.00 - 0.55 10?3/uL 0.13   ?Basophils 0.00 - 0.09 10?3/uL 0.02   ?Neutrophil % 32.0 - 70.0 % 42.8   ?Lymphocyte % 10.0 - 50.0 % 42.0   ?Monocyte % 4.0 - 13.0 % 12.1   ?Eosinophil % 1.0 - 5.0 % 2.5   ?Basophil% 0.0 - 2.0 % 0.4   ?Immature Granulocyte % <=0.7 % 0.2   ?Immature Granulocyte Count <=0.06 10^3/?L 0.01   ?Resulting Agency  [facility]  ?Specimen Collected: 08/27/21 12:44 Last Resulted: 08/27/21 13:40  ?Received From: [facility]  Result Received: 10/17/21 15:22  ? ?Hemoglobin A1C 4.2 - 5.6 % 6.8 High    ?Average Blood Glucose (Calc) mg/dL 148   ?Resulting Agency  [facility]  ?Narrative ?Performed by [facility] ?Normal Range:    4.2 - 5.6%  ?Increased Risk:  5.7 - 6.4%  ?Diabetes:        >= 6.5%  ?Glycemic Control for adults with diabetes:  <7%   ?Specimen Collected: 08/27/21 12:44 Last Resulted: 08/27/21 18:27  ?Received From: [facility]  Result Received: 10/17/21 15:22  ? ?Glucose 70 - 110  mg/dL 117 High    ?Sodium 136 - 145 mmol/L 139   ?Potassium 3.6 - 5.1 mmol/L 4.8   ?Chloride 97 - 109 mmol/L 107   ?Carbon Dioxide (CO2) 22.0 - 32.0 mmol/L 27.4   ?Urea Nitrogen (BUN) 7 - 25 mg/dL 34 High    ?Creatinine 0.6 - 1.1 mg/dL 1.6 High    ?Glomerular Filtration Rate (eGFR), MDRD Estimate >60 mL/min/1.73sq m  37 Low    ?Calcium 8.7 - 10.3 mg/dL 10.6 High    ?AST  8 - 39 U/L 17   ?ALT  5 - 38 U/L 18   ?Alk Phos (alkaline Phosphatase) 34 - 104 U/L 64   ?Albumin 3.5 - 4.8 g/dL 3.6   ?Bilirubin, Total 0.3 - 1.2 mg/dL 0.3   ?Protein, Total 6.1 - 7.9 g/dL 6.2   ?A/G Ratio 1.0 - 5.0 gm/dL 1.4   ?Resulting Agency  [facility]  ?Specimen Collected: 08/27/21 12:44 Last Resulted: 08/27/21 16:31  ?Received From: [facility]  Result Received: 10/17/21 15:22  ?I have reviewed the labs. ? ?Pertinent Imaging: ? 11/20/21 11:36  ?Scan Result 36m  ? ? ?Assessment & Plan:   ? ?1. OAB (overactive bladder) ?- Bladder Scan (Post Void Residual) in office ?- discussed increasing the Myrbetriq to 50 mg and/or having an appointment with Dr. MMatilde Sprangfor more evaluation ?- He would like to try the Myrbetriq 50 mg samples at this time and will call back if they desire an appointment with Dr. MMatilde Sprang? ?2.  Nocturnal enuresis ?- with risk factors of hypertension, diabetes, arthritis, heart disease and lymphedema it may be difficult to eliminate nighttime incontinence completely ? ? ?Return if symptoms worsen or fail to improve. ? ?These notes generated with voice recognition software. I apologize for typographical errors. ? ?Zerrick Hanssen, PA-C ? ?[facility] [place] [address]?[phone]?  ?

## 2021-12-21 ENCOUNTER — Other Ambulatory Visit: Payer: Self-pay | Admitting: Urology

## 2021-12-23 ENCOUNTER — Telehealth: Payer: Self-pay

## 2021-12-23 DIAGNOSIS — N3944 Nocturnal enuresis: Secondary | ICD-10-CM

## 2021-12-23 DIAGNOSIS — N3281 Overactive bladder: Secondary | ICD-10-CM

## 2021-12-23 MED ORDER — MIRABEGRON ER 50 MG PO TB24
50.0000 mg | ORAL_TABLET | Freq: Every day | ORAL | 11 refills | Status: DC
Start: 2021-12-23 — End: 2021-12-24

## 2021-12-23 NOTE — Telephone Encounter (Signed)
Pt's daughter LM on triage line stating that she called last Thursday requesting RX for Myrbetriq '50mg'$  be sent into [facility] as the medication seems to be working well.   RX for Trospium d/c from medication list. RX for Myrbetriq sent into [facility].

## 2021-12-24 MED ORDER — TROSPIUM CHLORIDE 20 MG PO TABS: 20.0000 mg | ORAL_TABLET | Freq: Every day | ORAL | 1 refills | Status: DC

## 2021-12-24 NOTE — Telephone Encounter (Signed)
Spoke to patient's daughter and per [facility] Trospium was sent to the pharmacy. She is to call our office back if this medication is not covered.

## 2021-12-24 NOTE — Addendum Note (Signed)
Addended by: Kyra Manges on: 12/24/2021 01:40 PM   Modules accepted: Orders

## 2021-12-24 NOTE — Telephone Encounter (Signed)
Patient's daughter called back today and the Medication Myrbetriq is going to cost her 240.00 with the co-pay. She is requesting a different medication.

## 2022-01-15 ENCOUNTER — Other Ambulatory Visit: Payer: Self-pay | Admitting: Neurosurgery

## 2022-01-15 DIAGNOSIS — D329 Benign neoplasm of meninges, unspecified: Secondary | ICD-10-CM

## 2022-02-03 ENCOUNTER — Other Ambulatory Visit: Payer: Self-pay | Admitting: Urology

## 2022-02-12 ENCOUNTER — Ambulatory Visit
Admission: RE | Admit: 2022-02-12 | Discharge: 2022-02-12 | Disposition: A | Discharge status: Home / Self Care | Payer: Medicare Other | Source: Ambulatory Visit | Attending: Neurosurgery | Admitting: Neurosurgery

## 2022-02-12 DIAGNOSIS — D329 Benign neoplasm of meninges, unspecified: Secondary | ICD-10-CM | POA: Insufficient documentation

## 2022-02-12 MED ORDER — GADOBUTROL 1 MMOL/ML IV SOLN
7.5000 mL | Freq: Once | INTRAVENOUS | Status: AC | PRN
  Administered 2022-02-12: 7.5 mL via INTRAVENOUS

## 2022-05-12 NOTE — Progress Notes (Deleted)
MRN : [id number]  Kelli Shaffer is a 81 y.o. (03-24-1941) female who presents with chief complaint of legs swell.  History of Present Illness:   The patient returns to the office for followup evaluation regarding leg swelling.  The swelling has persisted but with the lymph pump is under much, much better controlled. The pain associated with swelling is decreased. There have not been any interval development of a ulcerations or wounds.  The patient denies problems with the pump, noting it is working well and the leggings are in good condition.  Since the previous visit the patient has been wearing graduated compression stockings and using the lymph pump on a routine basis and  has noted significant improvement in the lymphedema.   Patient stated the lymph pump has been helpful with the treatment of the lymphedema.   No outpatient medications have been marked as taking for the 05/15/22 encounter (Appointment) with Delana Meyer, Dolores Lory, MD.    Past Medical History:  Diagnosis Date   Frequent UTI    Hypertension    LVH (left ventricular hypertrophy)    Meningioma ([place] [place])    Osteoarthritis     Past Surgical History:  Procedure Laterality Date   APPLICATION OF CRANIAL NAVIGATION Right 03/05/2021   Procedure: APPLICATION OF CRANIAL NAVIGATION;  Surgeon: Consuella Lose, MD;  Location: [facility];  Service: Neurosurgery;  Laterality: Right;   CESAREAN SECTION     CRANIOTOMY Right 03/05/2021   Procedure: STEREOTACTIC RIGHT FRONTAL CRANIOTOMY FOR RESECTION OF MENINGIOMA;  Surgeon: Consuella Lose, MD;  Location: [facility];  Service: Neurosurgery;  Laterality: Right;   HAND SURGERY Left    thumb    Social History Social History   Tobacco Use   Smoking status: Never   Smokeless tobacco: Never  Vaping Use   Vaping Use: Never used  Substance Use Topics   Alcohol use: Never   Drug use: Never    Family History Family History  Problem Relation Age of Onset   Hypertension Mother     Arthritis Father    Diabetes Daughter    Arthritis Daughter     Allergies  Allergen Reactions   Tilactase Other (See Comments) and Cough    Congestion, also   Penicillins Rash     REVIEW OF SYSTEMS (Negative unless checked)  Constitutional: '[]'$ Weight loss  '[]'$ Fever  '[]'$ Chills Cardiac: '[]'$ Chest pain   '[]'$ Chest pressure   '[]'$ Palpitations   '[]'$ Shortness of breath when laying flat   '[]'$ Shortness of breath with exertion. Vascular:  '[]'$ Pain in legs with walking   '[x]'$ Pain in legs with standing  '[]'$ History of DVT   '[]'$ Phlebitis   '[x]'$ Swelling in legs   '[]'$ Varicose veins   '[]'$ Non-healing ulcers Pulmonary:   '[]'$ Uses home oxygen   '[]'$ Productive cough   '[]'$ Hemoptysis   '[]'$ Wheeze  '[]'$ COPD   '[]'$ Asthma Neurologic:  '[]'$ Dizziness   '[]'$ Seizures   '[]'$ History of stroke   '[]'$ History of TIA  '[]'$ Aphasia   '[]'$ Vissual changes   '[]'$ Weakness or numbness in arm   '[]'$ Weakness or numbness in leg Musculoskeletal:   '[]'$ Joint swelling   '[]'$ Joint pain   '[]'$ Low back pain Hematologic:  '[]'$ Easy bruising  '[]'$ Easy bleeding   '[]'$ Hypercoagulable state   '[]'$ Anemic Gastrointestinal:  '[]'$ Diarrhea   '[]'$ Vomiting  '[]'$ Gastroesophageal reflux/heartburn   '[]'$ Difficulty swallowing. Genitourinary:  '[]'$ Chronic kidney disease   '[]'$ Difficult urination  '[]'$ Frequent urination   '[]'$ Blood in urine Skin:  '[]'$ Rashes   '[]'$ Ulcers  Psychological:  '[]'$ History of anxiety   '[]'$  History of major depression.  Physical  Examination  There were no vitals filed for this visit. There is no height or weight on file to calculate BMI. Gen: WD/WN, NAD Head: [place]/AT, No temporalis wasting.  Ear/Nose/Throat: Hearing grossly intact, nares w/o erythema or drainage, pinna without lesions Eyes: PER, EOMI, sclera nonicteric.  Neck: Supple, no gross masses.  No JVD.  Pulmonary:  Good air movement, no audible wheezing, no use of accessory muscles.  Cardiac: RRR, precordium not hyperdynamic. Vascular:  scattered varicosities present bilaterally.  Mild venous stasis changes to the legs bilaterally.  3-4+ soft  pitting edema, CEAP [id number]  Vessel Right Left  Radial Palpable Palpable  Gastrointestinal: soft, non-distended. No guarding/no peritoneal signs.  Musculoskeletal: M/S 5/5 throughout.  No deformity.  Neurologic: CN 2-12 intact. Pain and light touch intact in extremities.  Symmetrical.  Speech is fluent. Motor exam as listed above. Psychiatric: Judgment intact, Mood & affect appropriate for pt's clinical situation. Dermatologic: Venous rashes no ulcers noted.  No changes consistent with cellulitis. Lymph : No lichenification or skin changes of chronic lymphedema.  CBC Lab Results  Component Value Date   WBC 5.9 02/26/2021   HGB 9.5 (L) 03/05/2021   HCT 28.0 (L) 03/05/2021   MCV 99.7 02/26/2021   PLT 173 02/26/2021    BMET    Component Value Date/Time   NA 131 (L) 03/05/2021 0839   K 3.9 03/05/2021 0839   CL 108 02/26/2021 1528   CO2 26 02/26/2021 1528   GLUCOSE 122 (H) 02/26/2021 1528   BUN 26 (H) 02/26/2021 1528   CREATININE 1.66 (H) 02/26/2021 1528   CALCIUM 9.9 02/26/2021 1528   GFRNONAA 31 (L) 02/26/2021 1528   CrCl cannot be calculated (Patient's most recent lab result is older than the maximum 21 days allowed.).  COAG No results found for: "INR", "PROTIME"  Radiology No results found.   Assessment/Plan There are no diagnoses linked to this encounter.   Hortencia Pilar, MD  05/12/2022 12:15 PM

## 2022-05-15 ENCOUNTER — Ambulatory Visit (INDEPENDENT_AMBULATORY_CARE_PROVIDER_SITE_OTHER): Payer: Medicare Other | Admitting: Vascular Surgery

## 2022-05-15 DIAGNOSIS — I10 Essential (primary) hypertension: Secondary | ICD-10-CM

## 2022-05-15 DIAGNOSIS — K219 Gastro-esophageal reflux disease without esophagitis: Secondary | ICD-10-CM

## 2022-05-15 DIAGNOSIS — I890 Lymphedema, not elsewhere classified: Secondary | ICD-10-CM

## 2022-05-15 DIAGNOSIS — M5137 Other intervertebral disc degeneration, lumbosacral region: Secondary | ICD-10-CM

## 2022-06-05 ENCOUNTER — Ambulatory Visit (INDEPENDENT_AMBULATORY_CARE_PROVIDER_SITE_OTHER): Payer: Medicare Other | Admitting: Vascular Surgery

## 2022-07-14 ENCOUNTER — Other Ambulatory Visit: Payer: Self-pay

## 2022-07-14 DIAGNOSIS — R197 Diarrhea, unspecified: Secondary | ICD-10-CM | POA: Diagnosis present

## 2022-07-14 DIAGNOSIS — W07XXXA Fall from chair, initial encounter: Secondary | ICD-10-CM | POA: Diagnosis present

## 2022-07-14 DIAGNOSIS — E860 Dehydration: Secondary | ICD-10-CM | POA: Diagnosis present

## 2022-07-14 DIAGNOSIS — Z880 Allergy status to penicillin: Secondary | ICD-10-CM

## 2022-07-14 DIAGNOSIS — Z87440 Personal history of urinary (tract) infections: Secondary | ICD-10-CM

## 2022-07-14 DIAGNOSIS — Z888 Allergy status to other drugs, medicaments and biological substances status: Secondary | ICD-10-CM

## 2022-07-14 DIAGNOSIS — B9620 Unspecified Escherichia coli [E. coli] as the cause of diseases classified elsewhere: Secondary | ICD-10-CM | POA: Diagnosis present

## 2022-07-14 DIAGNOSIS — E669 Obesity, unspecified: Secondary | ICD-10-CM | POA: Diagnosis present

## 2022-07-14 DIAGNOSIS — N10 Acute tubulo-interstitial nephritis: Secondary | ICD-10-CM | POA: Diagnosis not present

## 2022-07-14 DIAGNOSIS — K219 Gastro-esophageal reflux disease without esophagitis: Secondary | ICD-10-CM | POA: Diagnosis present

## 2022-07-14 DIAGNOSIS — Z79899 Other long term (current) drug therapy: Secondary | ICD-10-CM

## 2022-07-14 DIAGNOSIS — N1832 Chronic kidney disease, stage 3b: Secondary | ICD-10-CM | POA: Diagnosis present

## 2022-07-14 DIAGNOSIS — R195 Other fecal abnormalities: Secondary | ICD-10-CM | POA: Diagnosis present

## 2022-07-14 DIAGNOSIS — D638 Anemia in other chronic diseases classified elsewhere: Secondary | ICD-10-CM | POA: Diagnosis present

## 2022-07-14 DIAGNOSIS — Z8249 Family history of ischemic heart disease and other diseases of the circulatory system: Secondary | ICD-10-CM

## 2022-07-14 DIAGNOSIS — N12 Tubulo-interstitial nephritis, not specified as acute or chronic: Secondary | ICD-10-CM | POA: Diagnosis not present

## 2022-07-14 DIAGNOSIS — Z6837 Body mass index (BMI) 37.0-37.9, adult: Secondary | ICD-10-CM

## 2022-07-14 DIAGNOSIS — Z1152 Encounter for screening for COVID-19: Secondary | ICD-10-CM

## 2022-07-14 DIAGNOSIS — N179 Acute kidney failure, unspecified: Secondary | ICD-10-CM | POA: Diagnosis present

## 2022-07-14 DIAGNOSIS — E872 Acidosis, unspecified: Secondary | ICD-10-CM | POA: Diagnosis present

## 2022-07-14 DIAGNOSIS — I129 Hypertensive chronic kidney disease with stage 1 through stage 4 chronic kidney disease, or unspecified chronic kidney disease: Secondary | ICD-10-CM | POA: Diagnosis present

## 2022-07-14 DIAGNOSIS — E871 Hypo-osmolality and hyponatremia: Secondary | ICD-10-CM | POA: Diagnosis present

## 2022-07-14 DIAGNOSIS — Z86011 Personal history of benign neoplasm of the brain: Secondary | ICD-10-CM

## 2022-07-14 DIAGNOSIS — D696 Thrombocytopenia, unspecified: Secondary | ICD-10-CM | POA: Diagnosis present

## 2022-07-14 LAB — CBC
HCT: 31.4 % — ABNORMAL LOW (ref 36.0–46.0)
Hemoglobin: 10.1 g/dL — ABNORMAL LOW (ref 12.0–15.0)
MCH: 31.3 pg (ref 26.0–34.0)
MCHC: 32.2 g/dL (ref 30.0–36.0)
MCV: 97.2 fL (ref 80.0–100.0)
Platelets: 142 10*3/uL — ABNORMAL LOW (ref 150–400)
RBC: 3.23 MIL/uL — ABNORMAL LOW (ref 3.87–5.11)
RDW: 14.2 % (ref 11.5–15.5)
WBC: 11.1 10*3/uL — ABNORMAL HIGH (ref 4.0–10.5)
nRBC: 0 % (ref 0.0–0.2)

## 2022-07-14 LAB — BASIC METABOLIC PANEL
Anion gap: 8 (ref 5–15)
BUN: 53 mg/dL — ABNORMAL HIGH (ref 8–23)
CO2: 19 mmol/L — ABNORMAL LOW (ref 22–32)
Calcium: 9.3 mg/dL (ref 8.9–10.3)
Chloride: 107 mmol/L (ref 98–111)
Creatinine, Ser: 3.39 mg/dL — ABNORMAL HIGH (ref 0.44–1.00)
GFR, Estimated: 13 mL/min — ABNORMAL LOW (ref >60)
Glucose, Bld: 148 mg/dL — ABNORMAL HIGH (ref 70–99)
Potassium: 4.3 mmol/L (ref 3.5–5.1)
Sodium: 134 mmol/L — ABNORMAL LOW (ref 135–145)

## 2022-07-14 NOTE — ED Triage Notes (Signed)
Pt comes via EMS from home with c/o weakness and diarrhea. Family also states belly pain and fever that all started this past weekend.   T-101.6 CBG-220 BP-126/69 97% RA   300 fluid with 22 right hand 650 tylenol for temp

## 2022-07-15 ENCOUNTER — Inpatient Hospital Stay
Admission: EM | Admit: 2022-07-15 | Discharge: 2022-07-18 | DRG: 690 | Disposition: A | Discharge status: Home Health | Payer: Medicare Other | Attending: Internal Medicine | Admitting: Internal Medicine

## 2022-07-15 ENCOUNTER — Emergency Department: Payer: Medicare Other

## 2022-07-15 ENCOUNTER — Encounter: Payer: Self-pay | Admitting: Internal Medicine

## 2022-07-15 DIAGNOSIS — R195 Other fecal abnormalities: Secondary | ICD-10-CM | POA: Diagnosis present

## 2022-07-15 DIAGNOSIS — D696 Thrombocytopenia, unspecified: Secondary | ICD-10-CM | POA: Insufficient documentation

## 2022-07-15 DIAGNOSIS — E669 Obesity, unspecified: Secondary | ICD-10-CM | POA: Insufficient documentation

## 2022-07-15 DIAGNOSIS — N12 Tubulo-interstitial nephritis, not specified as acute or chronic: Secondary | ICD-10-CM | POA: Diagnosis present

## 2022-07-15 DIAGNOSIS — E872 Acidosis, unspecified: Secondary | ICD-10-CM | POA: Insufficient documentation

## 2022-07-15 DIAGNOSIS — E871 Hypo-osmolality and hyponatremia: Secondary | ICD-10-CM | POA: Diagnosis present

## 2022-07-15 DIAGNOSIS — Z86011 Personal history of benign neoplasm of the brain: Secondary | ICD-10-CM | POA: Diagnosis not present

## 2022-07-15 DIAGNOSIS — I10 Essential (primary) hypertension: Secondary | ICD-10-CM | POA: Diagnosis present

## 2022-07-15 DIAGNOSIS — Z87440 Personal history of urinary (tract) infections: Secondary | ICD-10-CM | POA: Diagnosis not present

## 2022-07-15 DIAGNOSIS — Z79899 Other long term (current) drug therapy: Secondary | ICD-10-CM | POA: Diagnosis not present

## 2022-07-15 DIAGNOSIS — Z888 Allergy status to other drugs, medicaments and biological substances status: Secondary | ICD-10-CM | POA: Diagnosis not present

## 2022-07-15 DIAGNOSIS — B9620 Unspecified Escherichia coli [E. coli] as the cause of diseases classified elsewhere: Secondary | ICD-10-CM | POA: Diagnosis present

## 2022-07-15 DIAGNOSIS — N170 Acute kidney failure with tubular necrosis: Secondary | ICD-10-CM | POA: Diagnosis not present

## 2022-07-15 DIAGNOSIS — I129 Hypertensive chronic kidney disease with stage 1 through stage 4 chronic kidney disease, or unspecified chronic kidney disease: Secondary | ICD-10-CM | POA: Diagnosis present

## 2022-07-15 DIAGNOSIS — R197 Diarrhea, unspecified: Secondary | ICD-10-CM | POA: Diagnosis present

## 2022-07-15 DIAGNOSIS — N1832 Chronic kidney disease, stage 3b: Secondary | ICD-10-CM | POA: Diagnosis not present

## 2022-07-15 DIAGNOSIS — D638 Anemia in other chronic diseases classified elsewhere: Secondary | ICD-10-CM | POA: Diagnosis present

## 2022-07-15 DIAGNOSIS — N10 Acute tubulo-interstitial nephritis: Secondary | ICD-10-CM | POA: Diagnosis not present

## 2022-07-15 DIAGNOSIS — N179 Acute kidney failure, unspecified: Secondary | ICD-10-CM | POA: Diagnosis present

## 2022-07-15 DIAGNOSIS — Z8249 Family history of ischemic heart disease and other diseases of the circulatory system: Secondary | ICD-10-CM | POA: Diagnosis not present

## 2022-07-15 DIAGNOSIS — Z1152 Encounter for screening for COVID-19: Secondary | ICD-10-CM | POA: Diagnosis not present

## 2022-07-15 DIAGNOSIS — Z880 Allergy status to penicillin: Secondary | ICD-10-CM | POA: Diagnosis not present

## 2022-07-15 DIAGNOSIS — K219 Gastro-esophageal reflux disease without esophagitis: Secondary | ICD-10-CM | POA: Diagnosis present

## 2022-07-15 DIAGNOSIS — W07XXXA Fall from chair, initial encounter: Secondary | ICD-10-CM | POA: Diagnosis present

## 2022-07-15 DIAGNOSIS — E860 Dehydration: Secondary | ICD-10-CM | POA: Diagnosis present

## 2022-07-15 DIAGNOSIS — Z6837 Body mass index (BMI) 37.0-37.9, adult: Secondary | ICD-10-CM | POA: Diagnosis not present

## 2022-07-15 LAB — URINALYSIS, ROUTINE W REFLEX MICROSCOPIC
Bilirubin Urine: NEGATIVE
Glucose, UA: NEGATIVE mg/dL
Ketones, ur: NEGATIVE mg/dL
Nitrite: POSITIVE — AB
Protein, ur: 30 mg/dL — AB
Specific Gravity, Urine: 1.009 (ref 1.005–1.030)
WBC, UA: >50 WBC/hpf — ABNORMAL HIGH (ref 0–5)
pH: 6 (ref 5.0–8.0)

## 2022-07-15 LAB — RESP PANEL BY RT-PCR (RSV, FLU A&B, COVID)  RVPGX2
Influenza A by PCR: NEGATIVE
Influenza B by PCR: NEGATIVE
Resp Syncytial Virus by PCR: NEGATIVE
SARS Coronavirus 2 by RT PCR: NEGATIVE

## 2022-07-15 LAB — HEPATIC FUNCTION PANEL
ALT: 23 U/L (ref 0–44)
AST: 32 U/L (ref 15–41)
Albumin: 2.9 g/dL — ABNORMAL LOW (ref 3.5–5.0)
Alkaline Phosphatase: 59 U/L (ref 38–126)
Bilirubin, Direct: 0.2 mg/dL (ref 0.0–0.2)
Indirect Bilirubin: 0.3 mg/dL (ref 0.3–0.9)
Total Bilirubin: 0.5 mg/dL (ref 0.3–1.2)
Total Protein: 6.8 g/dL (ref 6.5–8.1)

## 2022-07-15 LAB — C DIFFICILE QUICK SCREEN W PCR REFLEX
C Diff antigen: NEGATIVE
C Diff interpretation: NOT DETECTED
C Diff toxin: NEGATIVE

## 2022-07-15 LAB — LIPASE, BLOOD: Lipase: 31 U/L (ref 11–51)

## 2022-07-15 MED ORDER — SODIUM CHLORIDE 0.9 % IV SOLN
1.0000 g | Freq: Once | INTRAVENOUS | Status: AC
  Administered 2022-07-15: 1 g via INTRAVENOUS
  Filled 2022-07-15: qty 10

## 2022-07-15 MED ORDER — DONEPEZIL HCL 5 MG PO TABS
5.0000 mg | ORAL_TABLET | Freq: Every day | ORAL | Status: DC
  Administered 2022-07-15 – 2022-07-18 (×4): 5 mg via ORAL
  Filled 2022-07-15 (×4): qty 1

## 2022-07-15 MED ORDER — ACETAMINOPHEN 500 MG PO TABS
1000.0000 mg | ORAL_TABLET | Freq: Once | ORAL | Status: AC
  Administered 2022-07-15: 1000 mg via ORAL
  Filled 2022-07-15: qty 2

## 2022-07-15 MED ORDER — SODIUM CHLORIDE 0.9 % IV BOLUS
1000.0000 mL | Freq: Once | INTRAVENOUS | Status: AC
  Administered 2022-07-15: 1000 mL via INTRAVENOUS

## 2022-07-15 MED ORDER — SODIUM BICARBONATE 650 MG PO TABS
1300.0000 mg | ORAL_TABLET | Freq: Two times a day (BID) | ORAL | Status: DC
  Administered 2022-07-15 – 2022-07-17 (×5): 1300 mg via ORAL
  Filled 2022-07-15 (×6): qty 2

## 2022-07-15 MED ORDER — ENOXAPARIN SODIUM 30 MG/0.3ML IJ SOSY: 30.0000 mg | PREFILLED_SYRINGE | INTRAMUSCULAR | Status: DC

## 2022-07-15 MED ORDER — HEPARIN SODIUM (PORCINE) 5000 UNIT/ML IJ SOLN
5000.0000 [IU] | Freq: Three times a day (TID) | INTRAMUSCULAR | Status: DC
  Administered 2022-07-15 – 2022-07-18 (×9): 5000 [IU] via SUBCUTANEOUS
  Filled 2022-07-15 (×9): qty 1

## 2022-07-15 MED ORDER — SODIUM CHLORIDE 0.9 % IV SOLN: INTRAVENOUS | Status: DC

## 2022-07-15 MED ORDER — SODIUM CHLORIDE 0.9 % IV SOLN
2.0000 g | INTRAVENOUS | Status: DC
  Administered 2022-07-16 – 2022-07-18 (×3): 2 g via INTRAVENOUS
  Filled 2022-07-15 (×2): qty 2
  Filled 2022-07-15: qty 20

## 2022-07-15 MED ORDER — ACETAMINOPHEN 325 MG PO TABS
650.0000 mg | ORAL_TABLET | Freq: Four times a day (QID) | ORAL | Status: AC | PRN
  Administered 2022-07-16: 650 mg via ORAL
  Filled 2022-07-15: qty 2

## 2022-07-15 MED ORDER — CARVEDILOL 3.125 MG PO TABS
3.1250 mg | ORAL_TABLET | Freq: Two times a day (BID) | ORAL | Status: DC
  Administered 2022-07-15 – 2022-07-18 (×6): 3.125 mg via ORAL
  Filled 2022-07-15 (×7): qty 1

## 2022-07-15 NOTE — H&P (Signed)
History and Physical    Patient: Kelli Shaffer [id number]:[id number] DOB: 04-Aug-1940 DOA: 07/15/2022 DOS: the patient was seen and examined on 07/15/2022 PCP: Ebbie Ridge, MD  Patient coming from: Home  Chief Complaint:  Chief Complaint  Patient presents with   Weakness   HPI: Kelli Shaffer is a 82 y.o. female with medical history significant of essential hypertension, left ventricular hypertrophy, GERD, came to the hospital with complaints of general weakness and diarrhea.  She also complaining of subjective fever. In the hospital, patient had a temperature 100.2, no significant tachycardia or tachypnea.  Oxygen saturation 100% on room air.  Lab test showed sodium 134, creatinine 3.39, hemoglobin 10.1, platelets 142.  Urine abnormal, urine culture sent out.  CT scan showed evidence of pyelonephritis.  Patient was given a dose of Rocephin, admitted for further treatment. Patient states that he has been doing well at home, she started have diarrhea for the last 4 to 5 days, she had multiple loose stools every day.  She has lower abdominal cramping pain.  But no nausea vomiting, she continued to drink and eat.  She denies any flank pain or dysuria.  Review of Systems: As mentioned in the history of present illness. All other systems reviewed and are negative. Past Medical History:  Diagnosis Date   Frequent UTI    Hypertension    LVH (left ventricular hypertrophy)    Meningioma (HCC)    Osteoarthritis    Past Surgical History:  Procedure Laterality Date   APPLICATION OF CRANIAL NAVIGATION Right 03/05/2021   Procedure: APPLICATION OF CRANIAL NAVIGATION;  Surgeon: Consuella Lose, MD;  Location: [facility];  Service: Neurosurgery;  Laterality: Right;   CESAREAN SECTION     CRANIOTOMY Right 03/05/2021   Procedure: STEREOTACTIC RIGHT FRONTAL CRANIOTOMY FOR RESECTION OF MENINGIOMA;  Surgeon: Consuella Lose, MD;  Location: [facility];  Service: Neurosurgery;  Laterality: Right;   HAND SURGERY Left     thumb   Social History:  reports that she has never smoked. She has never used smokeless tobacco. She reports that she does not drink alcohol and does not use drugs.  Allergies  Allergen Reactions   Tilactase Other (See Comments) and Cough    Congestion, also   Penicillins Rash    Family History  Problem Relation Age of Onset   Hypertension Mother    Arthritis Father    Diabetes Daughter    Arthritis Daughter     Prior to Admission medications   Medication Sig Start Date End Date Taking? Authorizing Provider  acetaminophen (TYLENOL) 500 MG tablet Take 1,000 mg by mouth daily as needed (for pain).    [provider]  carvedilol (COREG) 25 MG tablet Take 25 mg by mouth 2 (two) times daily with a meal.    [provider]  donepezil (ARICEPT) 5 MG tablet Take 5 mg by mouth daily. 04/17/21   [provider]  fluticasone (FLONASE) 50 MCG/ACT nasal spray Place 1-2 sprays into both nostrils daily.    [provider]  hydrochlorothiazide (HYDRODIURIL) 25 MG tablet Take 25 mg by mouth daily.    [provider]  loratadine (CLARITIN) 10 MG tablet Take 10 mg by mouth daily.    [provider]  Multiple Vitamins-Minerals (ONE-A-DAY PROACTIVE 65+) TABS Take 1 tablet by mouth daily with breakfast.    [provider]  spironolactone (ALDACTONE) 50 MG tablet Take 50 mg by mouth daily.    [provider]  traMADol (ULTRAM) 50 MG tablet Take  25 mg by mouth every 8 (eight) hours as needed (for pain).    [provider]  trospium (SANCTURA) 20 MG tablet TAKE 1 TABLET BY MOUTH DAILY 02/04/22   Nori Riis, PA-C    Physical Exam: Vitals:   07/15/22 0800 07/15/22 0828 07/15/22 0900 07/15/22 1000  BP: (!) 95/50  (!) 100/56 (!) 99/49  Pulse: 85  79 72  Resp: '16  16 16  '$ Temp:      TempSrc:      SpO2: 97%  100% 95%  Weight:  91 kg    Height:  '5\' 1"'$  (1.549 m)     Physical Exam Constitutional:      Appearance: She  is obese. She is not toxic-appearing.     Comments: drowsy  HENT:     Head: Normocephalic and atraumatic.     Nose: Nose normal. No congestion.     Mouth/Throat:     Mouth: Mucous membranes are moist.     Pharynx: No oropharyngeal exudate.  Eyes:     Extraocular Movements: Extraocular movements intact.     Conjunctiva/sclera: Conjunctivae normal.     Pupils: Pupils are equal, round, and reactive to light.  Cardiovascular:     Rate and Rhythm: Normal rate and regular rhythm.     Heart sounds: No murmur heard. Pulmonary:     Effort: Pulmonary effort is normal.     Breath sounds: Normal breath sounds.  Abdominal:     General: Abdomen is flat. Bowel sounds are normal.     Palpations: Abdomen is soft.     Tenderness: There is no abdominal tenderness.  Musculoskeletal:        General: No swelling. Normal range of motion.     Cervical back: Normal range of motion and neck supple. No rigidity or tenderness.     Right lower leg: No edema.     Left lower leg: No edema.  Skin:    General: Skin is warm and dry.     Coloration: Skin is not jaundiced.  Neurological:     Mental Status: She is oriented to person, place, and time.     Cranial Nerves: No cranial nerve deficit.     Comments: drowsy  Psychiatric:        Mood and Affect: Mood normal.        Thought Content: Thought content normal.     Data Reviewed:  Reviewed the CT results, reviewed all current lab results and the prior lab results.  Most recent creatinine was 1.9 in December 2023 in [facility] system.  Assessment and Plan: Acute pyelonephritis. Patient presents with diarrhea as a symptom of pyelonephritis.  UA grossly abnormal, urine culture pending. CT scan showed evidence of pyelonephritis. I will continue antibiotics with increase Rocephin dose to 2 g daily. Patient did not receive blood culture prior to giving antibiotics.  I have ordered 2 sets of blood culture, probably low yield due to antibiotics already be  given. Patient does not meet sepsis criteria.  Sepsis ruled out.  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 Hyponatremia. Metabolic acidosis Based on lab results from a month ago from [facility], patient renal function much worse. This is secondary to diarrhea with dehydration. Patient will be given normal saline at a lower rate.  Currently, patient has no evidence of volume overload.  I will also add sodium bicarbonate orally for metabolic acidosis.  Essential hypertension. Blood pressure is borderline, hold off all diuretics, reduce Coreg to 3.125  mg twice a day.  Obesity BMI 37.90. Diet and exercise advised.    Advance Care Planning:   Code Status: Full Code patient wants to be full code.  Consults: None  Family Communication: daughter updated over the phone  Severity of Illness: The appropriate patient status for this patient is INPATIENT. Inpatient status is judged to be reasonable and necessary in order to provide the required intensity of service to ensure the patient's safety. The patient's presenting symptoms, physical exam findings, and initial radiographic and laboratory data in the context of their chronic comorbidities is felt to place them at high risk for further clinical deterioration. Furthermore, it is not anticipated that the patient will be medically stable for discharge from the hospital within 2 midnights of admission.   * I certify that at the point of admission it is my clinical judgment that the patient will require inpatient hospital care spanning beyond 2 midnights from the point of admission due to high intensity of service, high risk for further deterioration and high frequency of surveillance required.*  Author: Sharen Hones, MD 07/15/2022 10:37 AM  For on call review [email or web address].

## 2022-07-15 NOTE — ED Notes (Signed)
Pt brief and linens changed, pt in gown with Purewick in place

## 2022-07-15 NOTE — Evaluation (Signed)
Physical Therapy Evaluation Patient Details Name: Kelli Shaffer MRN: [id number] DOB: Mar 02, 1941 Today's Date: 07/15/2022  History of Present Illness  82 y/o female presented to ED on 07/14/22 for weakness and diarrhea. CT showed pyelonephritis. PMH: frequent UTIs, HTN, left ventricular hypertrophy, hx of meningioma resection  Clinical Impression  Patient admitted with the above. PTA, patient lives with daughter and requires assistance for mobility with Kelli Shaffer (unsure if rollator or RW) and for ADLs. Patient presents with weakness, impaired balance, decreased activity tolerance, and impaired cognition. 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 Prior to mobility, patient found to be incontinent of urine and bowels. Required minA+2 for rolling L/R for pericare. Patient actively having bowel movement of loose stool so deferred further mobility. Patient will benefit from skilled PT services during acute stay to address listed deficits. Recommend HHPT at this time but will continue to assess to determine appropriate d/c plan.        Recommendations for follow up therapy are one component of a multi-disciplinary discharge planning process, led by the attending physician.  Recommendations may be updated based on patient status, additional functional criteria and insurance authorization.  Follow Up Recommendations Home health PT (will continue to assess with further mobility)      Assistance Recommended at Discharge Frequent or constant Supervision/Assistance  Patient can return home with the following  A lot of help with walking and/or transfers;A lot of help with bathing/dressing/bathroom;Assistance with cooking/housework;Direct supervision/assist for medications management;Direct supervision/assist for financial management;Assist for transportation;Help with stairs or ramp for entrance    Equipment Recommendations Other (comment) (TBD)  Recommendations for Other Services       Functional  Status Assessment Patient has had a recent decline in their functional status and demonstrates the ability to make significant improvements in function in a reasonable and predictable amount of time.     Precautions / Restrictions Precautions Precautions: Fall Restrictions Weight Bearing Restrictions: No      Mobility  Bed Mobility Overal bed mobility: Needs Assistance Bed Mobility: Rolling Rolling: Min assist, +2 for physical assistance, +2 for safety/equipment         General bed mobility comments: able to roll for pericare due to bowel and urine incontinence. Deferred further mobility due to patient still actively going and very loost stool.    Transfers                        Ambulation/Gait                  Stairs            Wheelchair Mobility    Modified Rankin (Stroke Patients Only)       Balance                                             Pertinent Vitals/Pain Pain Assessment Pain Assessment: Faces Faces Pain Scale: Hurts even more Pain Location: perineum area Pain Descriptors / Indicators: Grimacing, Guarding Pain Intervention(s): Monitored during session, Repositioned    Home Living Family/patient expects to be discharged to:: Private residence Living Arrangements: Children Available Help at Discharge: Family Type of Home: House Home Access: Ramped entrance       Home Layout: One level Home Equipment: Rollator (4 wheels)      Prior Function Prior Level of Function : Needs assist  Mobility Comments: reports using Kelli Shaffer but unsure if rollator or RW ADLs Comments: reports daughter assists with ADLs     Hand Dominance        Extremity/Trunk Assessment   Upper Extremity Assessment Upper Extremity Assessment: Generalized weakness    Lower Extremity Assessment Lower Extremity Assessment: Generalized weakness       Communication   Communication: No difficulties  Cognition  Arousal/Alertness: Awake/alert Behavior During Therapy: WFL for tasks assessed/performed Overall Cognitive Status: No family/caregiver present to determine baseline cognitive functioning                                 General Comments: per chart review, 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 No family present to determine baseline cognition        General Comments      Exercises     Assessment/Plan    PT Assessment Patient needs continued PT services  PT Problem List Decreased strength;Decreased activity tolerance;Decreased balance;Decreased mobility;Decreased knowledge of use of DME;Decreased safety awareness;Decreased knowledge of precautions       PT Treatment Interventions DME instruction;Gait training;Functional mobility training;Therapeutic activities;Therapeutic exercise;Balance training;Patient/family education    PT Goals (Current goals can be found in the Care Plan section)  Acute Rehab PT Goals Patient Stated Goal: did not state PT Goal Formulation: With patient Time For Goal Achievement: 07/29/22 Potential to Achieve Goals: Good    Frequency Min 2X/week     Co-evaluation               AM-PAC PT "6 Clicks" Mobility  Outcome Measure Help needed turning from your back to your side while in a flat bed without using bedrails?: A Little Help needed moving from lying on your back to sitting on the side of a flat bed without using bedrails?: Total Help needed moving to and from a bed to a chair (including a wheelchair)?: Total Help needed standing up from a chair using your arms (e.g., wheelchair or bedside chair)?: Total Help needed to walk in hospital room?: Total Help needed climbing 3-5 steps with a railing? : Total 6 Click Score: 8    End of Session   Activity Tolerance: Patient tolerated treatment well Patient left: in bed;with call bell/phone within reach Nurse Communication: Mobility status PT Visit Diagnosis: Muscle weakness (generalized)  (M62.81);Unsteadiness on feet (R26.81);Difficulty in walking, not elsewhere classified (R26.2)    Time: 1410-1439 PT Time Calculation (min) (ACUTE ONLY): 29 min   Charges:   PT Evaluation $PT Eval Moderate Complexity: 1 Mod PT Treatments $Therapeutic Activity: 8-22 mins        Kelli Shaffer A. Gilford Rile PT, DPT [facility]   Juell Radney A Gearldine Looney 07/15/2022, 3:11 PM

## 2022-07-15 NOTE — ED Provider Notes (Signed)
[facility] Provider Note    Event Date/Time   First MD Initiated Contact with Patient 07/15/22 [phone]     (approximate)   History   Chief Complaint: Weakness   HPI  Kelli Shaffer is a 82 y.o. female with a history of GERD, hypertension who comes the ED today complaining of generalized weakness and diarrhea, gradually worsening for the past 3 days.  Also having fever tonight and lower abdominal pain.  Also notes that stool is black.  She has been taking Pepto-Bismol to help settle her stomach.  Denies chest pain or shortness of breath or cough.  She is not vomiting, but has been limiting oral intake because it seems to provoke more diarrhea.     Physical Exam   Triage Vital Signs: ED Triage Vitals  Enc Vitals Group     BP 07/14/22 1801 (!) 104/53     Pulse Rate 07/14/22 1801 76     Resp 07/14/22 1801 18     Temp 07/14/22 1801 99.3 F (37.4 C)     Temp Source 07/14/22 1801 Oral     SpO2 07/14/22 1801 95 %     Weight --      Height --      Head Circumference --      Peak Flow --      Pain Score 07/14/22 1714 5     Pain Loc --      Pain Edu? --      Excl. in [place]? --     Most recent vital signs: Vitals:   07/14/22 1801 07/15/22 0445  BP: (!) 104/53 (!) 134/55  Pulse: 76 89  Resp: 18 17  Temp: 99.3 F (37.4 C) 100.2 F (37.9 C)  SpO2: 95% 100%    General: Awake, no distress.  CV:  Good peripheral perfusion.  Regular rate rhythm Resp:  Normal effort.  Clear to auscultation bilaterally Abd:  No distention.  Soft with lower abdominal tenderness.  Rectal exam reveals black stool, Hemoccult negative Other:  Dry mucous membranes   ED Results / Procedures / Treatments   Labs (all labs ordered are listed, but only abnormal results are displayed) Labs Reviewed  CBC - Abnormal; Notable for the following components:      Result Value   WBC 11.1 (*)    RBC 3.23 (*)    Hemoglobin 10.1 (*)    HCT 31.4 (*)    Platelets 142 (*)    All other  components within normal limits  BASIC METABOLIC PANEL - Abnormal; Notable for the following components:   Sodium 134 (*)    CO2 19 (*)    Glucose, Bld 148 (*)    BUN 53 (*)    Creatinine, Ser 3.39 (*)    GFR, Estimated 13 (*)    All other components within normal limits  HEPATIC FUNCTION PANEL - Abnormal; Notable for the following components:   Albumin 2.9 (*)    All other components within normal limits  RESP PANEL BY RT-PCR (RSV, FLU A&B, COVID)  RVPGX2  C DIFFICILE QUICK SCREEN W PCR REFLEX    URINE CULTURE  LIPASE, BLOOD  URINALYSIS, ROUTINE W REFLEX MICROSCOPIC  CBG MONITORING, ED     EKG Interpreted by me Normal sinus rhythm rate of 72.  Normal axis intervals QRS ST segments and T waves   RADIOLOGY CT abdomen pelvis interpreted by me, negative for bowel obstruction or perforation.  Radiology report reviewed noting signs of pyelonephritis  PROCEDURES:  Procedures   MEDICATIONS ORDERED IN ED: Medications  cefTRIAXone (ROCEPHIN) 1 g in sodium chloride 0.9 % 100 mL IVPB (has no administration in time range)  sodium chloride 0.9 % bolus 1,000 mL (1,000 mLs Intravenous New Bag/Given 07/15/22 0447)     IMPRESSION / MDM / ASSESSMENT AND PLAN / ED COURSE  I reviewed the triage vital signs and the nursing notes.                              Differential diagnosis includes, but is not limited to, dehydration, AKI, electrolyte abnormality, anemia, viral illness, UTI, diverticulitis, appendicitis  Patient's presentation is most consistent with acute presentation with potential threat to life or bodily function.  Patient presents with generalized weakness, abdominal pain and fever.  In the ED vital signs are unremarkable.  Serum labs show creatinine of 3.4 which represents AKI on CKD.  Stable chronic anemia.  Viral panel negative.  LFTs and lipase normal.  CT is suggestive of pyelonephritis.  She is not septic.  Will add on urine culture, give Rocephin, will need to  admit.       FINAL CLINICAL IMPRESSION(S) / ED DIAGNOSES   Final diagnoses:  AKI (acute kidney injury) ([place])  Pyelonephritis     Rx / DC Orders   ED Discharge Orders     None        Note:  This document was prepared using Dragon voice recognition software and may include unintentional dictation errors.   Carrie Mew, MD 07/15/22 [phone]

## 2022-07-16 DIAGNOSIS — N1832 Chronic kidney disease, stage 3b: Secondary | ICD-10-CM | POA: Diagnosis not present

## 2022-07-16 DIAGNOSIS — N170 Acute kidney failure with tubular necrosis: Secondary | ICD-10-CM | POA: Diagnosis not present

## 2022-07-16 DIAGNOSIS — E872 Acidosis, unspecified: Secondary | ICD-10-CM | POA: Diagnosis not present

## 2022-07-16 DIAGNOSIS — N10 Acute tubulo-interstitial nephritis: Secondary | ICD-10-CM | POA: Diagnosis not present

## 2022-07-16 LAB — CBC
HCT: 29.5 % — ABNORMAL LOW (ref 36.0–46.0)
Hemoglobin: 9.6 g/dL — ABNORMAL LOW (ref 12.0–15.0)
MCH: 30.6 pg (ref 26.0–34.0)
MCHC: 32.5 g/dL (ref 30.0–36.0)
MCV: 93.9 fL (ref 80.0–100.0)
Platelets: 154 10*3/uL (ref 150–400)
RBC: 3.14 MIL/uL — ABNORMAL LOW (ref 3.87–5.11)
RDW: 14.5 % (ref 11.5–15.5)
WBC: 12.1 10*3/uL — ABNORMAL HIGH (ref 4.0–10.5)
nRBC: 0 % (ref 0.0–0.2)

## 2022-07-16 LAB — BASIC METABOLIC PANEL
Anion gap: 9 (ref 5–15)
BUN: 44 mg/dL — ABNORMAL HIGH (ref 8–23)
CO2: 19 mmol/L — ABNORMAL LOW (ref 22–32)
Calcium: 8.8 mg/dL — ABNORMAL LOW (ref 8.9–10.3)
Chloride: 111 mmol/L (ref 98–111)
Creatinine, Ser: 2.13 mg/dL — ABNORMAL HIGH (ref 0.44–1.00)
GFR, Estimated: 23 mL/min — ABNORMAL LOW (ref >60)
Glucose, Bld: 98 mg/dL (ref 70–99)
Potassium: 3.7 mmol/L (ref 3.5–5.1)
Sodium: 139 mmol/L (ref 135–145)

## 2022-07-16 LAB — IRON AND TIBC
Iron: 21 ug/dL — ABNORMAL LOW (ref 28–170)
Saturation Ratios: 14 % (ref 10.4–31.8)
TIBC: 153 ug/dL — ABNORMAL LOW (ref 250–450)
UIBC: 132 ug/dL

## 2022-07-16 LAB — FERRITIN: Ferritin: 484 ng/mL — ABNORMAL HIGH (ref 11–307)

## 2022-07-16 LAB — VITAMIN B12: Vitamin B-12: 1840 pg/mL — ABNORMAL HIGH (ref 180–914)

## 2022-07-16 LAB — MAGNESIUM: Magnesium: 2 mg/dL (ref 1.7–2.4)

## 2022-07-16 MED ORDER — LOPERAMIDE HCL 2 MG PO CAPS
2.0000 mg | ORAL_CAPSULE | ORAL | Status: DC | PRN
  Administered 2022-07-16: 2 mg via ORAL
  Filled 2022-07-16: qty 1

## 2022-07-16 NOTE — Progress Notes (Signed)
  Progress Note   Patient: Kelli Shaffer LYY:[id number] DOB: 02-21-1941 DOA: 07/15/2022     1 DOS: the patient was seen and examined on 07/16/2022   Brief hospital course: Kelli Shaffer is a 82 y.o. female with medical history significant of essential hypertension, left ventricular hypertrophy, GERD, came to the hospital with complaints of general weakness and diarrhea.  She also complaining of subjective fever.  CT scan showed evidence of pyelonephritis without hydronephrosis.  She was started on Rocephin.  She also received IV fluids for acute renal failure.  Assessment and Plan: Acute pyelonephritis. Patient presents with diarrhea as a symptom of pyelonephritis.  UA grossly abnormal, urine culture pending. CT scan showed evidence of pyelonephritis. Patient condition seem to be improving, continue Rocephin.   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 Hyponatremia. Metabolic acidosis Based on lab results from a month ago from [facility], patient renal function much worse. This is secondary to diarrhea with dehydration. Patient is C diff toxin was negative.  Adding Imodium for diarrhea. Renal function have improved, discontinue IV fluids.  Continue oral sodium bicarb.   Essential hypertension. Continue to hold off diuretics, continue lower dose Coreg.   Obesity BMI 37.90. Diet and exercise advised.   Anemia of chronic disease. Iron appears to be adequate, B12 level pending.     Subjective:  She still has some diarrhea, significant weakness. Had a high fever last night.  Physical Exam: Vitals:   07/15/22 2233 07/16/22 0030 07/16/22 0412 07/16/22 0756  BP: 131/69 135/63 (!) 117/54 (!) 141/66  Pulse: 88 87 74 83  Resp: '17 16 16 18  '$ Temp: (!) 100.6 F (38.1 C) 99.3 F (37.4 C) 98.3 F (36.8 C) 98.3 F (36.8 C)  TempSrc: Oral Oral Oral Oral  SpO2: 97% 97% 98% 98%  Weight:      Height:       General exam: Appears calm and comfortable   Respiratory system: Clear to auscultation. Respiratory effort normal. Cardiovascular system: S1 & S2 heard, RRR. No JVD, murmurs, rubs, gallops or clicks. No pedal edema. Gastrointestinal system: Abdomen is nondistended, soft and nontender. No organomegaly or masses felt. Normal bowel sounds heard. Central nervous system: Alert and oriented x2. No focal neurological deficits. Extremities: Symmetric 5 x 5 power. Skin: No rashes, lesions or ulcers Psychiatry: Judgement and insight appear normal. Mood & affect appropriate.   Data Reviewed:  Lab results reviewed.  Family Communication: 2 daughters updated separately over the phone.  Disposition: Status is: Inpatient Remains inpatient appropriate because: Severity of disease, IV treatment.  Planned Discharge Destination:  TBD    Time spent: 50 minutes  Author: Sharen Hones, MD 07/16/2022 10:58 AM  For on call review [email or web address].

## 2022-07-16 NOTE — Progress Notes (Signed)
Physical Therapy Treatment Patient Details Name: Kelli Shaffer MRN: [id number] DOB: [birth year] Today's Date: 07/16/2022   History of Present Illness 82 y/o female presented to ED on 07/14/22 for weakness and diarrhea. CT showed pyelonephritis. PMH: frequent UTIs, HTN, left ventricular hypertrophy, hx of meningioma resection    PT Comments    Pt received in chair without chair alarm on agreeable to PT interventions. Pt is pleasant and motivated with fair recall memory. Pt performed AROM to BLE and instructed to perform the same every 2 hours. Provided written exs instructions on the white board. Pt ambulated in the room and in the hallways with FWW with CGA of 1 with one standing rest. Pt adivsed to call for help so that she can use bathroom. Chair alarm on and needs within reach while pt left in chair. Pt tol treatment well. Pt has four wheel walker at home. PT will continue and has referred th ept to mobility specialist to promote functional progress. Pt's one daughter has 5 STE and second one has a ramp. PT advised pt to use front wheel walker until receives training with four wheel walker. Pt lives with her daughters and they take turns to care for her.  Current POC is appropriate.   Recommendations for follow up therapy are one component of a multi-disciplinary discharge planning process, led by the attending physician.  Recommendations may be updated based on patient status, additional functional criteria and insurance authorization.  Follow Up Recommendations  Home health PT     Assistance Recommended at Discharge Frequent or constant Supervision/Assistance  Patient can return home with the following A little help with walking and/or transfers;A little help with bathing/dressing/bathroom;Assistance with cooking/housework;Direct supervision/assist for medications management;Direct supervision/assist for financial management;Help with stairs or ramp for entrance (one daughter has 5 STE and second  has a ramp.)   Equipment Recommendations       Recommendations for Other Services       Precautions / Restrictions Precautions Precautions: Fall Restrictions Weight Bearing Restrictions: No     Mobility  Bed Mobility:               General bed mobility comments: received in chair    Transfers Overall transfer level: Needs assistance Equipment used: Rolling walker (2 wheels) Transfers: Sit to/from Stand Sit to Stand: Supervision                Ambulation/Gait Ambulation/Gait assistance: Min guard Gait Distance (Feet): 160 Feet Assistive device: Rolling walker (2 wheels) Gait Pattern/deviations: Decreased stride length Gait velocity: dec     General Gait Details: steady   Stairs: N/T             Wheelchair Mobility    Modified Rankin (Stroke Patients Only)       Balance Overall balance assessment: Needs assistance   Sitting balance-Leahy Scale: Normal     Standing balance support: Reliant on assistive device for balance, Bilateral upper extremity supported Standing balance-Leahy Scale: Good Standing balance comment: steady on feet with FWW. pt should use FWW untill re-trained with 4 wheel walker.                            Cognition Arousal/Alertness: Awake/alert Behavior During Therapy: WFL for tasks assessed/performed Overall Cognitive Status: No family/caregiver present to determine baseline cognitive functioning  Exercises General Exercises - Lower Extremity Ankle Circles/Pumps: AROM, 20 reps, Seated Heel Slides: AROM, 20 reps, Seated    General Comments        Pertinent Vitals/Pain Pain Assessment Pain Assessment: No/denies pain    Home Living                          Prior Function            PT Goals (current goals can now be found in the care plan section) Acute Rehab PT Goals Patient Stated Goal: " go home with my daughters PT  Goal Formulation: With patient Time For Goal Achievement: 07/29/22 Potential to Achieve Goals: Good Progress towards PT goals: Progressing toward goals    Frequency    Min 2X/week      PT Plan Current plan remains appropriate    Co-evaluation              AM-PAC PT "6 Clicks" Mobility   Outcome Measure  Help needed turning from your back to your side while in a flat bed without using bedrails?: A Little Help needed moving from lying on your back to sitting on the side of a flat bed without using bedrails?: A Little Help needed moving to and from a bed to a chair (including a wheelchair)?: A Little Help needed standing up from a chair using your arms (e.g., wheelchair or bedside chair)?: A Little Help needed to walk in hospital room?: A Little Help needed climbing 3-5 steps with a railing? : A Lot 6 Click Score: 17    End of Session Equipment Utilized During Treatment: Gait belt Activity Tolerance: Patient tolerated treatment well Patient left: in chair;with chair alarm set Nurse Communication: Mobility status PT Visit Diagnosis: Muscle weakness (generalized) (M62.81);Unsteadiness on feet (R26.81);Difficulty in walking, not elsewhere classified (R26.2)     Time: 4818-5631 PT Time Calculation (min) (ACUTE ONLY): 17 min  Charges:  $Gait Training: 8-22 mins                    Johann Gascoigne PT DPT 1:09 PM,07/16/22

## 2022-07-16 NOTE — Progress Notes (Deleted)
MRN : [id number]  Kelli Shaffer is a 82 y.o. (20-Oct-1940) female who presents with chief complaint of legs swell.  History of Present Illness:   The patient returns to the office for followup evaluation regarding leg swelling.  The swelling has persisted and the pain associated with swelling continues. There have not been any interval development of a ulcerations or wounds.  Since the previous visit the patient has been wearing graduated compression stockings and has noted little if any improvement in the lymphedema. The patient has been using compression routinely morning until night.  The patient also states elevation during the day and exercise is being done too.  No outpatient medications have been marked as taking for the 07/17/22 encounter (Appointment) with Delana Meyer, Dolores Lory, MD.    Past Medical History:  Diagnosis Date   Frequent UTI    Hypertension    LVH (left ventricular hypertrophy)    Meningioma ([place])    Osteoarthritis     Past Surgical History:  Procedure Laterality Date   APPLICATION OF CRANIAL NAVIGATION Right 03/05/2021   Procedure: APPLICATION OF CRANIAL NAVIGATION;  Surgeon: Consuella Lose, MD;  Location: [place];  Service: Neurosurgery;  Laterality: Right;   CESAREAN SECTION     CRANIOTOMY Right 03/05/2021   Procedure: STEREOTACTIC RIGHT FRONTAL CRANIOTOMY FOR RESECTION OF MENINGIOMA;  Surgeon: Consuella Lose, MD;  Location: [place];  Service: Neurosurgery;  Laterality: Right;   HAND SURGERY Left    thumb    Social History Social History   Tobacco Use   Smoking status: Never   Smokeless tobacco: Never  Vaping Use   Vaping Use: Never used  Substance Use Topics   Alcohol use: Never   Drug use: Never    Family History Family History  Problem Relation Age of Onset   Hypertension Mother    Arthritis Father    Diabetes Daughter    Arthritis Daughter     Allergies  Allergen Reactions   Tilactase Other (See Comments) and Cough     Congestion, also   Penicillins Rash     REVIEW OF SYSTEMS (Negative unless checked)  Constitutional: []$ Weight loss  []$ Fever  []$ Chills Cardiac: []$ Chest pain   []$ Chest pressure   []$ Palpitations   []$ Shortness of breath when laying flat   []$ Shortness of breath with exertion. Vascular:  []$ Pain in legs with walking   [x]$ Pain in legs with standing  []$ History of DVT   []$ Phlebitis   [x]$ Swelling in legs   []$ Varicose veins   []$ Non-healing ulcers Pulmonary:   []$ Uses home oxygen   []$ Productive cough   []$ Hemoptysis   []$ Wheeze  []$ COPD   []$ Asthma Neurologic:  []$ Dizziness   []$ Seizures   []$ History of stroke   []$ History of TIA  []$ Aphasia   []$ Vissual changes   []$ Weakness or numbness in arm   []$ Weakness or numbness in leg Musculoskeletal:   []$ Joint swelling   [x]$ Joint pain   []$ Low back pain Hematologic:  []$ Easy bruising  []$ Easy bleeding   []$ Hypercoagulable state   []$ Anemic Gastrointestinal:  []$ Diarrhea   []$ Vomiting  [x]$ Gastroesophageal reflux/heartburn   []$ Difficulty swallowing. Genitourinary:  []$ Chronic kidney disease   []$ Difficult urination  []$ Frequent urination   []$ Blood in urine Skin:  []$ Rashes   []$ Ulcers  Psychological:  []$ History of anxiety   []$  History of major depression.  Physical Examination  There were no vitals filed for this visit. There is no height or weight on file to calculate BMI. Gen: WD/WN, NAD Head: [place]/AT, No temporalis wasting.  Ear/Nose/Throat:  Hearing grossly intact, nares w/o erythema or drainage, pinna without lesions Eyes: PER, EOMI, sclera nonicteric.  Neck: Supple, no gross masses.  No JVD.  Pulmonary:  Good air movement, no audible wheezing, no use of accessory muscles.  Cardiac: RRR, precordium not hyperdynamic. Vascular:  scattered varicosities present bilaterally.  Mild venous stasis changes to the legs bilaterally.  3-4+ soft pitting edema, CEAP C4sEpAsPr  Vessel Right Left  Radial Palpable Palpable  Gastrointestinal: soft, non-distended. No guarding/no peritoneal signs.   Musculoskeletal: M/S 5/5 throughout.  No deformity.  Neurologic: CN 2-12 intact. Pain and light touch intact in extremities.  Symmetrical.  Speech is fluent. Motor exam as listed above. Psychiatric: Judgment intact, Mood & affect appropriate for pt's clinical situation. Dermatologic: Venous rashes no ulcers noted.  No changes consistent with cellulitis. Lymph : No lichenification or skin changes of chronic lymphedema.  CBC Lab Results  Component Value Date   WBC 12.1 (H) 07/16/2022   HGB 9.6 (L) 07/16/2022   HCT 29.5 (L) 07/16/2022   MCV 93.9 07/16/2022   PLT 154 07/16/2022    BMET    Component Value Date/Time   NA 139 07/16/2022 0431   K 3.7 07/16/2022 0431   CL 111 07/16/2022 0431   CO2 19 (L) 07/16/2022 0431   GLUCOSE 98 07/16/2022 0431   BUN 44 (H) 07/16/2022 0431   CREATININE 2.13 (H) 07/16/2022 0431   CALCIUM 8.8 (L) 07/16/2022 0431   GFRNONAA 23 (L) 07/16/2022 0431   Estimated Creatinine Clearance: 20.6 mL/min (A) (by C-G formula based on SCr of 2.13 mg/dL (H)).  COAG No results found for: "INR", "PROTIME"  Radiology CT ABDOMEN PELVIS WO CONTRAST  Result Date: 07/15/2022 CLINICAL DATA:  82 year old female with acute abdominal pain. EXAM: CT ABDOMEN AND PELVIS WITHOUT CONTRAST TECHNIQUE: Multidetector CT imaging of the abdomen and pelvis was performed following the standard protocol without IV contrast. RADIATION DOSE REDUCTION: This exam was performed according to the departmental dose-optimization program which includes automated exposure control, adjustment of the mA and/or kV according to patient size and/or use of iterative reconstruction technique. COMPARISON:  Noncontrast CT Abdomen and Pelvis 05/21/2021. FINDINGS: Lower chest: Heart size remains normal. No pericardial or pleural effusion. Negative lung bases. Hepatobiliary: Stable and negative noncontrast liver and gallbladder. Pancreas: Stable and negative noncontrast pancreas. Spleen: Negative. Adrenals/Urinary  Tract: Stable and negative noncontrast adrenal glands. Noncontrast left kidney appears stable since 2022. No convincing obstruction or urinary calculus on that side. But the noncontrast right kidney today appears asymmetrically indistinct, inflamed. See coronal image 48. Still, no obvious hydronephrosis. No nephrolithiasis identified. Proximal right ureter might be inflamed but is nondilated. Chronic pelvic phleboliths. Diminutive urinary bladder. No perivesical inflammation. No obvious right ureteral calculus. Stomach/Bowel: Decompressed rectum. Some oral contrast or other dense enteric material present, and has reached the rectum. Descending and sigmoid diverticulosis is extensive. But no active inflammation is identified. Redundant transverse colon with mild to moderate diverticulosis. Cecum is on a lax mesentery located in the central abdomen today. The cecum is mildly distended with gas, but the appendix remains normal tracking laterally from the cecum on coronal image 28. Decompressed terminal ileum. No convincing large bowel inflammation. No dilated small bowel. Possible small gastric hiatal hernia or phrenic ampulla (normal variant). Small volume fluid in the stomach. Relatively decompressed duodenum. No free air or free fluid identified. Vascular/Lymphatic: Aortoiliac calcified atherosclerosis. Vascular patency is not evaluated in the absence of IV contrast. Normal caliber abdominal aorta. No lymphadenopathy identified. Reproductive: Noncontrast appearance stable  and within normal limits. Other: No pelvic free fluid.  Chronic pelvic phleboliths. Musculoskeletal: Chronic lumbar spine degeneration including L3-L4 spondylolisthesis, facet arthropathy, and up to severe multifactorial spinal stenosis there. Stable asymmetric left femoral head and acetabular degenerative spurring and/or dystrophic calcification. No acute osseous abnormality identified. IMPRESSION: 1. Noncontrast Right kidney appears inflamed  although not obviously obstructed. Consider Acute Pyelonephritis, or ascending UTI. CT abdomen with IV contrast may evaluate further. No definite urinary calculus. 2. No other acute or inflammatory process identified in the noncontrast abdomen or pelvis. Normal appendix. Extensive diverticulosis of the large bowel but no active inflammation identified. 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 Aortic Atherosclerosis (ICD10-I70.0). Electronically Signed   By: Genevie Ann M.D.   On: 07/15/2022 05:00     Assessment/Plan There are no diagnoses linked to this encounter.   Hortencia Pilar, MD  07/16/2022 10:43 AM

## 2022-07-16 NOTE — TOC Initial Note (Signed)
Transition of Care [facility]) - Initial/Assessment Note    Patient Details  Name: Kelli Shaffer MRN: [id number] Date of Birth: [birth year]  [facility] CM/SW Contact:    Gerilyn Pilgrim, LCSW Phone Number: 07/16/2022, 1:40 PM  Clinical Narrative:  SW spoke with daughter priscilla. She states the patient stays with her and her sister on and off. She states that the patient is getting an aide that comes 9 hours a week through [facility]. Daughter states pt has used [facility] before and would like to use them again if possible. Daughter would also like [place] to look into getting the pt a new rollator. Daughter states pt PCP is still Dr. Ola Spurr at [facility]. SW will work on getting pt DME arranged and HH.                Expected Discharge Plan: [place] Barriers to Discharge: Continued Medical Work up   Patient Goals and CMS Choice Patient states their goals for this hospitalization and ongoing recovery are:: return home with [facility] CMS Medicare.gov Compare Post Acute Care list provided to:: Patient Represenative (must comment) (daughter) Choice offered to / list presented to : Adult Children      Expected Discharge Plan and Services       Living arrangements for the past 2 months: Single Family Home                                      Prior Living Arrangements/Services Living arrangements for the past 2 months: Single Family Home Lives with:: Adult Children Patient language and need for interpreter reviewed:: Yes Do you feel safe going back to the place where you live?: Yes      Need for Family Participation in Patient Care: Yes (Comment) Care giver support system in place?: Yes (comment) Current home services: DME    Activities of Daily Living Home Assistive Devices/Equipment: Cane (specify quad or straight), Walker (specify type) ADL Screening (condition at time of admission) Patient's cognitive ability adequate to safely  complete daily activities?: Yes Is the patient deaf or have difficulty hearing?: No Does the patient have difficulty seeing, even when wearing glasses/contacts?: No Does the patient have difficulty concentrating, remembering, or making decisions?: No Patient able to express need for assistance with ADLs?: Yes Does the patient have difficulty dressing or bathing?: No Independently performs ADLs?: Yes (appropriate for developmental age) Does the patient have difficulty walking or climbing stairs?: Yes Weakness of Legs: None Weakness of Arms/Hands: None  Permission Sought/Granted      Share Information with NAME: daughter priscilla summers     Permission granted to share info w Relationship: daughter     Emotional Assessment              Admission diagnosis:  Pyelonephritis [N12] AKI (acute kidney injury) ([place]) [N17.9] Acute pyelonephritis [N10] Patient Active Problem List   Diagnosis Date Noted   Acute pyelonephritis 07/15/2022   Acute renal failure superimposed on stage 3b chronic kidney disease ([place]) 07/15/2022   Hyponatremia 07/15/2022   Thrombocytopenia ([place]) 07/15/2022   Obesity (BMI 30-39.9) 46/27/0350   Metabolic acidosis 09/38/1829   Lymphedema 11/14/2021   Difficulty walking 04/16/2021   Expressive aphasia 04/16/2021   History of meningioma 04/16/2021   Imbalance 04/16/2021   Loss of memory 04/16/2021   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   DDD (degenerative disc disease),  lumbosacral 02/27/2021   Preoperative evaluation of a medical condition to rule out surgical contraindications (TAR required) 11/08/2020   Colon adenoma 04/24/2020   Meningioma ([place]) 04/28/2019   Primary osteoarthritis of both hips 02/17/2017   GERD without esophagitis 11/22/2015   LVH (left ventricular hypertrophy) 15/94/5859   Systolic murmur 29/24/4628   HTN (hypertension) 10/13/2012   OA (osteoarthritis) 10/13/2012   Sciatica 10/13/2012   PCP:  Ebbie Ridge,  MD Pharmacy:   [facility] [place], [place] [address] [facility] [address] [place] [address] Phone: [phone] Fax: [place], [address] [place] [address] Phone: [phone] Fax: [phone]     Social Determinants of Health (SDOH) Social History: [place]: No Food Insecurity (07/15/2022)  Housing: Low Risk  (07/15/2022)  Transportation Needs: No Transportation Needs (07/15/2022)  Utilities: Not At Risk (07/15/2022)  Tobacco Use: Low Risk  (07/15/2022)   SDOH Interventions: Food Insecurity Interventions: Intervention Not Indicated Housing Interventions: [id number] Referral Transportation Interventions: Intervention Not Indicated Utilities Interventions: Intervention Not Indicated   Readmission Risk Interventions     No data to display

## 2022-07-16 NOTE — Evaluation (Signed)
Occupational Therapy Evaluation Patient Details Name: Kelli Shaffer MRN: [id number] DOB: 06/29/1941 Today's Date: 07/16/2022   History of Present Illness 82 y/o female presented to ED on 07/14/22 for weakness and diarrhea. CT showed pyelonephritis. PMH: frequent UTIs, HTN, left ventricular hypertrophy, hx of meningioma resection   Clinical Impression   Patient presenting with decreased independence in self care, balance, functional mobility/transfers, endurance, and safety awareness. PTA pt lived with daughter and received assistance for ADLs/IADLs PRN. 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 Pt currently functioning at supervision for bed mobility, supervision-CGA for STS from varying transfer surfaces, and Min guard for step pivot transfer from EOB>recliner using RW. Pt required Min A for UB dressing, Max A for posterior hygiene after incontinent BM in bed, and Min guard to don mesh underwear in standing. Pt will benefit from acute OT to increase overall independence in the areas of ADLs and functional mobility in order to safely discharge home. Pt could benefit from [facility] following D/C to decrease falls risk, improve balance, and maximize independence in self-care within own home environment.       Recommendations for follow up therapy are one component of a multi-disciplinary discharge planning process, led by the attending physician.  Recommendations may be updated based on patient status, additional functional criteria and insurance authorization.   Follow Up Recommendations  Home health OT     Assistance Recommended at Discharge Frequent or constant Supervision/Assistance  Patient can return home with the following A little help with walking and/or transfers;A little help with bathing/dressing/bathroom;Assistance with cooking/housework;Assist for transportation;Help with stairs or ramp for entrance    Functional Status Assessment  Patient has had a recent decline in their  functional status and demonstrates the ability to make significant improvements in function in a reasonable and predictable amount of time.  Equipment Recommendations  None recommended by OT    Recommendations for Other Services       Precautions / Restrictions Precautions Precautions: Fall Restrictions Weight Bearing Restrictions: No      Mobility Bed Mobility Overal bed mobility: Needs Assistance Bed Mobility: Supine to Sit     Supine to sit: Supervision, HOB elevated     General bed mobility comments: increased time/effort, use of bed rails. Mod A to scoot hips forward at EOB    Transfers Overall transfer level: Needs assistance Equipment used: Rolling walker (2 wheels) Transfers: Sit to/from Stand, Bed to chair/wheelchair/BSC Sit to Stand: Supervision, Min guard     Step pivot transfers: Min guard     General transfer comment: Min guard to stand from EOB and supervision to stand from recliner, VC for hand placement/technique      Balance Overall balance assessment: Needs assistance Sitting-balance support: Feet supported Sitting balance-Leahy Scale: Good     Standing balance support: Bilateral upper extremity supported, No upper extremity supported, During functional activity Standing balance-Leahy Scale: Fair                             ADL either performed or assessed with clinical judgement   ADL Overall ADL's : Needs assistance/impaired                 Upper Body Dressing : Minimal assistance;Sitting Upper Body Dressing Details (indicate cue type and reason): to don/doff gown Lower Body Dressing: Set up;Sitting/lateral leans;Min guard;Supervision/safety Lower Body Dressing Details (indicate cue type and reason): set up-supervision to thread mesh underwear over B feet in sitting,  Min guard for safety to hike over hips in standing Toilet Transfer: Rolling walker (2 wheels);Min guard Toilet Transfer Details (indicate cue type and  reason): simulated with step pivot transfer from EOB>recliner Toileting- Clothing Manipulation and Hygiene: Maximal assistance;Sit to/from stand Toileting - Clothing Manipulation Details (indicate cue type and reason): for posterior hygiene after incontinent BM in bed             Vision Baseline Vision/History: 1 Wears glasses Patient Visual Report: No change from baseline       Perception     Praxis      Pertinent Vitals/Pain Pain Assessment Pain Assessment: No/denies pain     Hand Dominance Right   Extremity/Trunk Assessment Upper Extremity Assessment Upper Extremity Assessment: Generalized weakness   Lower Extremity Assessment Lower Extremity Assessment: Generalized weakness       Communication Communication Communication: No difficulties   Cognition Arousal/Alertness: Awake/alert Behavior During Therapy: WFL for tasks assessed/performed Overall Cognitive Status: History of cognitive impairments - at baseline                                 General Comments: Per chart review, 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 No family present to confirm. Able to follow all commands well.     General Comments       Exercises Other Exercises Other Exercises: OT provided education re: role of OT, OT POC, post acute recs, sitting up for all meals, EOB/OOB mobility with assistance, home/fall safety.     Shoulder Instructions      Home Living Family/patient expects to be discharged to:: Private residence Living Arrangements: Children Available Help at Discharge: Family Type of Home: House Home Access: [employer]: One level     Bathroom Shower/Tub: Tub/shower unit   [place] (4 wheels);Tub bench   Additional Comments: Pt possibly poor historian      Prior Functioning/Environment Prior Level of Function : Needs assist             Mobility Comments: reports using walker but unsure if  rollator or RW, reports 1 fall recently ADLs Comments: Pt reports daughter assists with ADLs/IADLs PRN, reports still driving and uses pill box for medication management        OT Problem List: Decreased strength;Decreased activity tolerance;Impaired balance (sitting and/or standing);Decreased safety awareness;Decreased cognition      OT Treatment/Interventions: Self-care/ADL training;Therapeutic exercise;Therapeutic activities;Cognitive remediation/compensation;Energy conservation;DME and/or AE instruction;Patient/family education;Balance training    OT Goals(Current goals can be found in the care plan section) Acute Rehab OT Goals Patient Stated Goal: return home OT Goal Formulation: With patient Time For Goal Achievement: 07/30/22 Potential to Achieve Goals: Good   OT Frequency: Min 2X/week    Co-evaluation              AM-PAC OT "6 Clicks" Daily Activity     Outcome Measure Help from another person eating meals?: None Help from another person taking care of personal grooming?: A Little Help from another person toileting, which includes using toliet, bedpan, or urinal?: A Lot Help from another person bathing (including washing, rinsing, drying)?: A Lot Help from another person to put on and taking off regular upper body clothing?: A Little Help from another person to put on and taking off regular lower body clothing?: A Little 6 Click Score: 17   End of Session Equipment Utilized During  Treatment: Gait belt;Rolling walker (2 wheels) Nurse Communication: Mobility status  Activity Tolerance: Patient tolerated treatment well Patient left: in chair;with call bell/phone within reach;with chair alarm set  OT Visit Diagnosis: Unsteadiness on feet (R26.81);Muscle weakness (generalized) (M62.81);Other symptoms and signs involving cognitive function                Time: 8182-9937 OT Time Calculation (min): 38 min Charges:  OT General Charges $OT Visit: 1 Visit OT  Evaluation $OT Eval Low Complexity: 1 Low OT Treatments $Self Care/Home Management : 8-22 mins  [facility] MS, OTR/L ascom [phone]  07/16/22, 1:53 PM

## 2022-07-16 NOTE — Hospital Course (Signed)
Kelli Shaffer is a 82 y.o. female with medical history significant of essential hypertension, left ventricular hypertrophy, GERD, came to the hospital with complaints of general weakness and diarrhea.  She also complaining of subjective fever.  CT scan showed evidence of pyelonephritis without hydronephrosis.  She was started on Rocephin.  She also received IV fluids for acute renal failure. 1/11.  Renal function improved, IV fluids discontinued.  However, patient urine culture came back with E. coli, no oral antibiotic option.  ID consult obtained.

## 2022-07-17 ENCOUNTER — Ambulatory Visit (INDEPENDENT_AMBULATORY_CARE_PROVIDER_SITE_OTHER): Payer: Medicare Other | Admitting: Vascular Surgery

## 2022-07-17 DIAGNOSIS — N1832 Chronic kidney disease, stage 3b: Secondary | ICD-10-CM | POA: Diagnosis not present

## 2022-07-17 DIAGNOSIS — N10 Acute tubulo-interstitial nephritis: Secondary | ICD-10-CM | POA: Diagnosis not present

## 2022-07-17 DIAGNOSIS — N170 Acute kidney failure with tubular necrosis: Secondary | ICD-10-CM | POA: Diagnosis not present

## 2022-07-17 DIAGNOSIS — N179 Acute kidney failure, unspecified: Secondary | ICD-10-CM

## 2022-07-17 LAB — BASIC METABOLIC PANEL
Anion gap: 7 (ref 5–15)
BUN: 35 mg/dL — ABNORMAL HIGH (ref 8–23)
CO2: 22 mmol/L (ref 22–32)
Calcium: 9.1 mg/dL (ref 8.9–10.3)
Chloride: 108 mmol/L (ref 98–111)
Creatinine, Ser: 1.71 mg/dL — ABNORMAL HIGH (ref 0.44–1.00)
GFR, Estimated: 29 mL/min — ABNORMAL LOW (ref >60)
Glucose, Bld: 102 mg/dL — ABNORMAL HIGH (ref 70–99)
Potassium: 3.8 mmol/L (ref 3.5–5.1)
Sodium: 137 mmol/L (ref 135–145)

## 2022-07-17 LAB — MAGNESIUM: Magnesium: 1.8 mg/dL (ref 1.7–2.4)

## 2022-07-17 NOTE — Progress Notes (Signed)
Physical Therapy Treatment Patient Details Name: Kelli Shaffer MRN: [id number] DOB: June 30, 1941 Today's Date: 07/17/2022   History of Present Illness 82 y/o female presented to ED on 07/14/22 for weakness and diarrhea. CT showed pyelonephritis. PMH: frequent UTIs, HTN, left ventricular hypertrophy, hx of meningioma resection    PT Comments    Patient sitting up in chair on arrival to room, requesting to return to bed. Patient declined ambulating due to fatigue, having already walked twice this morning. Min guard and cues for technique provided for transfers from bed to chair. Educated patient on energy conservation techniques to use in home setting. Anticipate patient can return home with assistance from family. PT will continue to follow to maximize independence and decrease caregiver burden.    Recommendations for follow up therapy are one component of a multi-disciplinary discharge planning process, led by the attending physician.  Recommendations may be updated based on patient status, additional functional criteria and insurance authorization.  Follow Up Recommendations  Home health PT     Assistance Recommended at Discharge Frequent or constant Supervision/Assistance  Patient can return home with the following A little help with walking and/or transfers;A little help with bathing/dressing/bathroom;Assist for transportation;Help with stairs or ramp for entrance   Equipment Recommendations       Recommendations for Other Services       Precautions / Restrictions Precautions Precautions: Fall Restrictions Weight Bearing Restrictions: No     Mobility  Bed Mobility Overal bed mobility: Needs Assistance Bed Mobility: Sit to Supine       Sit to supine: Min guard   General bed mobility comments: Min gaurd for safety. increased time and effort with cues for sequencing    Transfers Overall transfer level: Needs assistance Equipment used: Rolling walker (2 wheels) Transfers: Sit  to/from Stand Sit to Stand: Supervision   Step pivot transfers: Min guard       General transfer comment: Min gaurd for safety with step pivot transfer from bed to recliner chair. verbal cues for technique.    Ambulation/Gait               General Gait Details: patient declined ambulating due to having already walked 2 bouts this morning.   Stairs             Wheelchair Mobility    Modified Rankin (Stroke Patients Only)       Balance Overall balance assessment: Needs assistance Sitting-balance support: Feet supported Sitting balance-Leahy Scale: Good     Standing balance support: During functional activity, Bilateral upper extremity supported, Single extremity supported Standing balance-Leahy Scale: Fair                              Cognition Arousal/Alertness: Awake/alert Behavior During Therapy: WFL for tasks assessed/performed Overall Cognitive Status: Within Functional Limits for tasks assessed                                          Exercises      General Comments General comments (skin integrity, edema, etc.): educated patient on energy conservation techniques to use in home setting      Pertinent Vitals/Pain Pain Assessment Pain Assessment: No/denies pain    Home Living  Prior Function            PT Goals (current goals can now be found in the care plan section) Acute Rehab PT Goals Patient Stated Goal: to go home PT Goal Formulation: With patient Time For Goal Achievement: 07/29/22 Potential to Achieve Goals: Good Progress towards PT goals: Progressing toward goals    Frequency    Min 2X/week      PT Plan Current plan remains appropriate    Co-evaluation              AM-PAC PT "6 Clicks" Mobility   Outcome Measure  Help needed turning from your back to your side while in a flat bed without using bedrails?: A Little Help needed moving from lying  on your back to sitting on the side of a flat bed without using bedrails?: A Little Help needed moving to and from a bed to a chair (including a wheelchair)?: A Little Help needed standing up from a chair using your arms (e.g., wheelchair or bedside chair)?: A Little Help needed to walk in hospital room?: A Little Help needed climbing 3-5 steps with a railing? : A Lot 6 Click Score: 17    End of Session   Activity Tolerance: Patient tolerated treatment well Patient left: in bed;with call bell/phone within reach;with bed alarm set Nurse Communication: Mobility status PT Visit Diagnosis: Muscle weakness (generalized) (M62.81);Unsteadiness on feet (R26.81);Difficulty in walking, not elsewhere classified (R26.2)     Time: 8916-9450 PT Time Calculation (min) (ACUTE ONLY): 11 min  Charges:  $Therapeutic Activity: 8-22 mins                     Minna Merritts, PT, MPT    Percell Locus 07/17/2022, 3:10 PM

## 2022-07-17 NOTE — Progress Notes (Signed)
Occupational Therapy Treatment Patient Details Name: Kelli Shaffer MRN: [id number] DOB: 10/18/1940 Today's Date: 07/17/2022   History of present illness 82 y/o female presented to ED on 07/14/22 for weakness and diarrhea. CT showed pyelonephritis. PMH: frequent UTIs, HTN, left ventricular hypertrophy, hx of meningioma resection   OT comments  Kelli Shaffer makes good effort today and participates eagerly in therapy. She states she is feeling well, denies pain. She is able to perform bed mobility w/o physical assistance, transfer bed to recliner w CGA, ambulates with RW w/ good  balance (pt normally uses 4WW/rollator), moving very slowly but with good safety awareness. Can perform groomimg in standing at sink w/ 1-hand UE support on sink cabinet. Pt will benefit from [place] to increase fxl mobility, improve balance, and decrease risk of falls.       Recommendations for follow up therapy are one component of a multi-disciplinary discharge planning process, led by the attending physician.  Recommendations may be updated based on patient status, additional functional criteria and insurance authorization.    Follow Up Recommendations  Home health OT     Assistance Recommended at Discharge Frequent or constant Supervision/Assistance  Patient can return home with the following  A little help with walking and/or transfers;A little help with bathing/dressing/bathroom;Assistance with cooking/housework;Assist for transportation;Help with stairs or ramp for entrance   Equipment Recommendations  None recommended by OT    Recommendations for Other Services      Precautions / Restrictions Precautions Precautions: Fall       Mobility Bed Mobility Overal bed mobility: Needs Assistance Bed Mobility: Supine to Sit     Supine to sit: Supervision, HOB elevated     General bed mobility comments: increased time/effort, no physical assist required    Transfers Overall transfer level: Needs  assistance Equipment used: Rolling walker (2 wheels) Transfers: Sit to/from Stand, Bed to chair/wheelchair/BSC Sit to Stand: Supervision     Step pivot transfers: Min guard           Balance Overall balance assessment: Needs assistance Sitting-balance support: Feet supported Sitting balance-Leahy Scale: Good     Standing balance support: During functional activity, Bilateral upper extremity supported, Single extremity supported Standing balance-Leahy Scale: Good                             ADL either performed or assessed with clinical judgement   ADL Overall ADL's : Needs assistance/impaired Eating/Feeding: Supervision/ safety Eating/Feeding Details (indicate cue type and reason): Minimal help with openign containers Grooming: Wash/dry hands;Wash/dry face;Standing;Oral care;Min guard           Upper Body Dressing : Min guard;Sitting Upper Body Dressing Details (indicate cue type and reason): to don/doff gown                        Extremity/Trunk Assessment Upper Extremity Assessment Upper Extremity Assessment: Generalized weakness   Lower Extremity Assessment Lower Extremity Assessment: Generalized weakness        Vision Patient Visual Report: No change from baseline     Perception     Praxis      Cognition Arousal/Alertness: Awake/alert Behavior During Therapy: WFL for tasks assessed/performed Overall Cognitive Status: History of cognitive impairments - at baseline                                 General Comments: Not  oriented to time, situation. States that she has been at the hospital for over 2 weeks        Exercises Other Exercises Other Exercises: Educ re: importance of OOB mobility/activity, home safety, falls prevention.    Shoulder Instructions       General Comments      Pertinent Vitals/ Pain       Pain Assessment Pain Assessment: No/denies pain  Home Living                                           Prior Functioning/Environment              Frequency  Min 2X/week        Progress Toward Goals  OT Goals(current goals can now be found in the care plan section)  Progress towards OT goals: Progressing toward goals  Acute Rehab OT Goals OT Goal Formulation: With patient Time For Goal Achievement: 07/30/22 Potential to Achieve Goals: Good  Plan      Co-evaluation                 AM-PAC OT "6 Clicks" Daily Activity     Outcome Measure   Help from another person eating meals?: None Help from another person taking care of personal grooming?: A Little Help from another person toileting, which includes using toliet, bedpan, or urinal?: A Lot Help from another person bathing (including washing, rinsing, drying)?: A Lot Help from another person to put on and taking off regular upper body clothing?: A Little Help from another person to put on and taking off regular lower body clothing?: A Little 6 Click Score: 17    End of Session Equipment Utilized During Treatment: Rolling walker (2 wheels)  OT Visit Diagnosis: Unsteadiness on feet (R26.81);Muscle weakness (generalized) (M62.81);Other symptoms and signs involving cognitive function   Activity Tolerance Patient tolerated treatment well   Patient Left in chair;with call bell/phone within reach;with chair alarm set   Nurse Communication          Time: [phone] OT Time Calculation (min): 20 min  Charges: OT General Charges $OT Visit: 1 Visit OT Treatments $Self Care/Home Management : 8-22 mins  Josiah Lobo, PhD, MS, OTR/L 07/17/22, 12:27 PM

## 2022-07-17 NOTE — Progress Notes (Addendum)
  Progress Note   Patient: Kelli Shaffer JHE:[id number] DOB: 1940-08-24 DOA: 07/15/2022     2 DOS: the patient was seen and examined on 07/17/2022   Brief hospital course: Tyjah Morgenthaler is a 82 y.o. female with medical history significant of essential hypertension, left ventricular hypertrophy, GERD, came to the hospital with complaints of general weakness and diarrhea.  She also complaining of subjective fever.  CT scan showed evidence of pyelonephritis without hydronephrosis.  She was started on Rocephin.  She also received IV fluids for acute renal failure. 1/11.  Renal function improved, IV fluids discontinued.  However, patient urine culture came back with E. coli, no oral antibiotic option.  ID consult obtained.  Assessment and Plan: Acute pyelonephritis due to E. coli. Patient presents with diarrhea as a symptom of pyelonephritis.  UA grossly abnormal, urine culture pending. CT scan showed evidence of pyelonephritis. Urine culture came back with E. coli, no oral antibiotic options.  ID consult today.  Most likely will place a midline tomorrow and discharge with IV antibiotics (will obtain opinion from nephrology for midline).   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 Hyponatremia. Metabolic acidosis Based on lab results from a month ago from [facility], patient renal function much worse. This is secondary to diarrhea with dehydration. Patient is C diff toxin was negative.  Adding Imodium for diarrhea. Renal function close to baseline, multiple acidosis improved.  Discontinue sodium bicarbonate.   Essential hypertension. Continue to hold off diuretics, continue lower dose Coreg.   Obesity BMI 37.90. Diet and exercise advised.    Anemia of chronic disease. Iron appears to be adequate, B12 level elevated Hb is stable.       Subjective:  Patient doing much better, no additional diarrhea.  No nausea vomiting.  Physical Exam: Vitals:   07/16/22  1632 07/16/22 1955 07/17/22 0515 07/17/22 0813  BP: 139/71 (!) 150/63 (!) 146/84 131/70  Pulse: 87 86 85 84  Resp: '18 20 18 16  '$ Temp: 99.1 F (37.3 C) (!) 101.3 F (38.5 C) 98.5 F (36.9 C) 98 F (36.7 C)  TempSrc: Oral Oral  Oral  SpO2: 98% 98% 100% 96%  Weight:      Height:       General exam: Appears calm and comfortable  Respiratory system: Clear to auscultation. Respiratory effort normal. Cardiovascular system: S1 & S2 heard, RRR. No JVD, murmurs, rubs, gallops or clicks. No pedal edema. Gastrointestinal system: Abdomen is nondistended, soft and nontender. No organomegaly or masses felt. Normal bowel sounds heard. Central nervous system: Alert and oriented x2. No focal neurological deficits. Extremities: Symmetric 5 x 5 power. Skin: No rashes, lesions or ulcers Psychiatry: Judgement and insight appear normal. Mood & affect appropriate.   Data Reviewed:  Lab results reviewed.  Family Communication: Daughter updated on the phone.  Disposition: Status is: Inpatient Remains inpatient appropriate because: Disease severity, IV antibiotics.  Planned Discharge Destination: Home with Home Health    Time spent: 35 minutes  Author: Sharen Hones, MD 07/17/2022 11:19 AM  For on call review [email or web address].

## 2022-07-17 NOTE — TOC Progression Note (Signed)
Transition of Care [facility]) - Progression Note    Patient Details  Name: Destane Speas MRN: [id number] Date of Birth: 26-Jul-1940  Transition of Care [facility]) CM/SW Contact  Gerilyn Pilgrim, LCSW Phone Number: 07/17/2022, 2:18 PM  Clinical Narrative:   SW spoke with daughter Sellas who states she is almost legally blind and cannot assist with infusion at discharge. Per pam at [employer], daughter priscilla cannot assist either as she currently has pneumonia.     Expected Discharge Plan: [place] Barriers to Discharge: Continued Medical Work up  Expected Discharge Plan and Services       Living arrangements for the past 2 months: Single Family Home                                       Social Determinants of Health (SDOH) Interventions SDOH Screenings   Food Insecurity: No Food Insecurity (07/15/2022)  Housing: Low Risk  (07/15/2022)  Transportation Needs: No Transportation Needs (07/15/2022)  Utilities: Not At Risk (07/15/2022)  Tobacco Use: Low Risk  (07/15/2022)    Readmission Risk Interventions     No data to display

## 2022-07-17 NOTE — TOC Progression Note (Signed)
Transition of Care [facility]) - Progression Note    Patient Details  Name: Kelli Shaffer MRN: [id number] Date of Birth: 1941/03/20  Transition of Care [facility]) CM/SW Contact  Gerilyn Pilgrim, LCSW Phone Number: 07/17/2022, 12:16 PM  Clinical Narrative:   Per MD, pt may need home infusion. SW waiting for ID to consult. SW reached out to [facility] with [facility] to see if nursing can be added to [facility] order. SW also reached out to [facility] with [facility] to coordinate the teaching and medication.     Expected Discharge Plan: [place] Barriers to Discharge: Continued Medical Work up  Expected Discharge Plan and Services       Living arrangements for the past 2 months: Single Family Home                                       Social Determinants of Health (SDOH) Interventions SDOH Screenings   Food Insecurity: No Food Insecurity (07/15/2022)  Housing: Low Risk  (07/15/2022)  Transportation Needs: No Transportation Needs (07/15/2022)  Utilities: Not At Risk (07/15/2022)  Tobacco Use: Low Risk  (07/15/2022)    Readmission Risk Interventions     No data to display

## 2022-07-17 NOTE — Consult Note (Addendum)
[facility]  CONSULT NOTE    Date: 07/17/2022                  Patient Name:  Kelli Shaffer  MRN: [id number]  DOB: 07-Sep-1940  Age / Sex: 82 y.o., female         PCP: Ebbie Ridge, MD                 Service Requesting Consult: [facility]                 Reason for Consult: Chronic kidney disease 3a            History of Present Illness: Kelli Shaffer is a 82 y.o.  female with hypertension, LVH and GERD, who was admitted to [facility] on 07/15/2022 for Pyelonephritis [N12] AKI (acute kidney injury) ([place]) [N17.9] Acute pyelonephritis [N10]  Patient presented to the ED with weakness. Reported low grade temp, without shortness of breath. CT scan confirmed pyelonephritis. Patient seen resting quietly, no family at bedside. States she is feeling better than initial presentation. Tolerating meals. Currently receiving treatment for that. Long term treatment plan includes antibiotics with need for midline placement.    Medications: Outpatient medications: Medications Prior to Admission  Medication Sig Dispense Refill Last Dose   acetaminophen (TYLENOL) 500 MG tablet Take 1,000 mg by mouth daily as needed (for pain).   prn   carvedilol (COREG) 25 MG tablet Take 25 mg by mouth 2 (two) times daily with a meal.   07/14/2022   fluticasone (FLONASE) 50 MCG/ACT nasal spray Place 1-2 sprays into both nostrils daily.   07/14/2022   hydrochlorothiazide (HYDRODIURIL) 25 MG tablet Take 25 mg by mouth daily.   07/14/2022   loratadine (CLARITIN) 10 MG tablet Take 10 mg by mouth daily.   07/14/2022   Multiple Vitamins-Minerals (ONE-A-DAY PROACTIVE 65+) TABS Take 1 tablet by mouth daily with breakfast.   07/14/2022   spironolactone (ALDACTONE) 50 MG tablet Take 50 mg by mouth daily.   07/14/2022   traMADol (ULTRAM) 50 MG tablet Take 25 mg by mouth every 8 (eight) hours as needed (for pain).   07/14/2022   donepezil (ARICEPT) 10 MG tablet Take 10 mg by mouth daily.   07/13/2022   trospium (SANCTURA) 20 MG  tablet TAKE 1 TABLET BY MOUTH DAILY (Patient not taking: Reported on 07/15/2022) 60 tablet 5 Not Taking    Current medications: Current Facility-Administered Medications  Medication Dose Route Frequency Provider Last Rate Last Admin   carvedilol (COREG) tablet 3.125 mg  3.125 mg Oral BID WC Sharen Hones, MD   3.125 mg at 07/17/22 0921   cefTRIAXone (ROCEPHIN) 2 g in sodium chloride 0.9 % 100 mL IVPB  2 g Intravenous Q24H Sharen Hones, MD 200 mL/hr at 07/17/22 0923 2 g at 07/17/22 0923   donepezil (ARICEPT) tablet 5 mg  5 mg Oral Daily Sharen Hones, MD   5 mg at 07/17/22 0921   heparin injection 5,000 Units  5,000 Units Subcutaneous Q8H Dorothe Pea, RPH   5,000 Units at 07/17/22 0631   loperamide (IMODIUM) capsule 2 mg  2 mg Oral PRN Sharen Hones, MD   2 mg at 07/16/22 4401      Allergies: Allergies  Allergen Reactions   Tilactase Other (See Comments) and Cough    Congestion, also   Penicillins Rash      Past Medical History: Past Medical History:  Diagnosis Date   Frequent UTI  Hypertension    LVH (left ventricular hypertrophy)    Meningioma (HCC)    Osteoarthritis      Past Surgical History: Past Surgical History:  Procedure Laterality Date   APPLICATION OF CRANIAL NAVIGATION Right 03/05/2021   Procedure: APPLICATION OF CRANIAL NAVIGATION;  Surgeon: Consuella Lose, MD;  Location: [facility];  Service: Neurosurgery;  Laterality: Right;   CESAREAN SECTION     CRANIOTOMY Right 03/05/2021   Procedure: STEREOTACTIC RIGHT FRONTAL CRANIOTOMY FOR RESECTION OF MENINGIOMA;  Surgeon: Consuella Lose, MD;  Location: [facility];  Service: Neurosurgery;  Laterality: Right;   HAND SURGERY Left    thumb     Family History: Family History  Problem Relation Age of Onset   Hypertension Mother    Arthritis Father    Diabetes Daughter    Arthritis Daughter      Social History: Social History   Socioeconomic History   Marital status: Widowed    Spouse name: Not on file    Number of children: Not on file   Years of education: Not on file   Highest education level: Not on file  Occupational History   Not on file  Tobacco Use   Smoking status: Never   Smokeless tobacco: Never  Vaping Use   Vaping Use: Never used  Substance and Sexual Activity   Alcohol use: Never   Drug use: Never   Sexual activity: Not on file  Other Topics Concern   Not on file  Social History Narrative   Not on file   Social Determinants of Health   Financial Resource Strain: Not on file  Food Insecurity: No Food Insecurity (07/15/2022)   Hunger Vital Sign    Worried About Running Out of Food in the Last Year: Never true    Ran Out of Food in the Last Year: Never true  Transportation Needs: No Transportation Needs (07/15/2022)   PRAPARE - Hydrologist (Medical): No    Lack of Transportation (Non-Medical): No  Physical Activity: Not on file  Stress: Not on file  Social Connections: Not on file  Intimate Partner Violence: Not At Risk (07/15/2022)   Humiliation, Afraid, Rape, and Kick questionnaire    Fear of Current or Ex-Partner: No    Emotionally Abused: No    Physically Abused: No    Sexually Abused: No     Review of Systems: Review of Systems  Constitutional:  Positive for fever and malaise/fatigue. Negative for chills.  HENT:  Negative for congestion, sore throat and tinnitus.   Eyes:  Negative for blurred vision and redness.  Respiratory:  Negative for cough, shortness of breath and wheezing.   Cardiovascular:  Negative for chest pain, palpitations, claudication and leg swelling.  Gastrointestinal:  Negative for abdominal pain, blood in stool, diarrhea, nausea and vomiting.  Genitourinary:  Negative for flank pain, frequency and hematuria.  Musculoskeletal:  Negative for back pain, falls and myalgias.  Skin:  Negative for rash.  Neurological:  Positive for weakness. Negative for dizziness and headaches.  Endo/Heme/Allergies:  Does not  bruise/bleed easily.  Psychiatric/Behavioral:  Negative for depression. The patient is not nervous/anxious and does not have insomnia.     Vital Signs: Blood pressure 131/70, pulse 84, temperature 98 F (36.7 C), temperature source Oral, resp. rate 16, height '5\' 1"'$  (1.549 m), weight 91 kg, SpO2 96 %.  Weight trends: Filed Weights   07/15/22 0828  Weight: 91 kg    Physical Exam: General: NAD  Head: Normocephalic,  atraumatic. Moist oral mucosal membranes  Eyes: Anicteric  Lungs:  Clear to auscultation, normal effort  Heart: Regular rate and rhythm  Abdomen:  Soft, nontender  Extremities:  Trace peripheral edema.  Neurologic: Nonfocal, moving all four extremities  Skin: No lesions  Access: None     Lab results: Basic Metabolic Panel: Recent Labs  Lab 07/14/22 1900 07/16/22 0431 07/17/22 0541  NA 134* 139 137  K 4.3 3.7 3.8  CL 107 111 108  CO2 19* 19* 22  GLUCOSE 148* 98 102*  BUN 53* 44* 35*  CREATININE 3.39* 2.13* 1.71*  CALCIUM 9.3 8.8* 9.1  MG  --  2.0 1.8    Liver Function Tests: Recent Labs  Lab 07/14/22 1900  AST 32  ALT 23  ALKPHOS 59  BILITOT 0.5  PROT 6.8  ALBUMIN 2.9*   Recent Labs  Lab 07/14/22 1900  LIPASE 31   No results for input(s): "AMMONIA" in the last 168 hours.  CBC: Recent Labs  Lab 07/14/22 1715 07/16/22 0431  WBC 11.1* 12.1*  HGB 10.1* 9.6*  HCT 31.4* 29.5*  MCV 97.2 93.9  PLT 142* 154    Cardiac Enzymes: No results for input(s): "CKTOTAL", "CKMB", "CKMBINDEX", "TROPONINI" in the last 168 hours.  BNP: Invalid input(s): "POCBNP"  CBG: No results for input(s): "GLUCAP" in the last 168 hours.  Microbiology: Results for orders placed or performed during the hospital encounter of 07/15/22  Resp panel by RT-PCR (RSV, Flu A&B, Covid) Anterior Nasal Swab     Status: None   Collection Time: 07/15/22  4:49 AM   Specimen: Anterior Nasal Swab  Result Value Ref Range Status   SARS Coronavirus 2 by RT PCR NEGATIVE  NEGATIVE Final    Comment: (NOTE) SARS-CoV-2 target nucleic acids are NOT DETECTED.  The SARS-CoV-2 RNA is generally detectable in upper respiratory specimens during the acute phase of infection. The lowest concentration of SARS-CoV-2 viral copies this assay can detect is 138 copies/mL. A negative result does not preclude SARS-Cov-2 infection and should not be used as the sole basis for treatment or other patient management decisions. A negative result may occur with  improper specimen collection/handling, submission of specimen other than nasopharyngeal swab, presence of viral mutation(s) within the areas targeted by this assay, and inadequate number of viral copies(<138 copies/mL). A negative result must be combined with clinical observations, patient history, and epidemiological information. The expected result is Negative.  Fact Sheet for Patients:  EntrepreneurPulse.com.au  Fact Sheet for Healthcare Providers:  IncredibleEmployment.be  This test is no t yet approved or cleared by the [place] FDA and  has been authorized for detection and/or diagnosis of SARS-CoV-2 by FDA under an Emergency Use Authorization (EUA). This EUA will remain  in effect (meaning this test can be used) for the duration of the COVID-19 declaration under Section 564(b)(1) of the Act, 21 U.S.C.section 360bbb-3(b)(1), unless the authorization is terminated  or revoked sooner.       Influenza A by PCR NEGATIVE NEGATIVE Final   Influenza B by PCR NEGATIVE NEGATIVE Final    Comment: (NOTE) The Xpert Xpress SARS-CoV-2/FLU/RSV plus assay is intended as an aid in the diagnosis of influenza from Nasopharyngeal swab specimens and should not be used as a sole basis for treatment. Nasal washings and aspirates are unacceptable for Xpert Xpress SARS-CoV-2/FLU/RSV testing.  Fact Sheet for Patients: EntrepreneurPulse.com.au  Fact Sheet for Healthcare  Providers: IncredibleEmployment.be  This test is not yet approved or cleared by the [place] FDA  and has been authorized for detection and/or diagnosis of SARS-CoV-2 by FDA under an Emergency Use Authorization (EUA). This EUA will remain in effect (meaning this test can be used) for the duration of the COVID-19 declaration under Section 564(b)(1) of the Act, 21 U.S.C. section 360bbb-3(b)(1), unless the authorization is terminated or revoked.     Resp Syncytial Virus by PCR NEGATIVE NEGATIVE Final    Comment: (NOTE) Fact Sheet for Patients: EntrepreneurPulse.com.au  Fact Sheet for Healthcare Providers: IncredibleEmployment.be  This test is not yet approved or cleared by the [place] FDA and has been authorized for detection and/or diagnosis of SARS-CoV-2 by FDA under an Emergency Use Authorization (EUA). This EUA will remain in effect (meaning this test can be used) for the duration of the COVID-19 declaration under Section 564(b)(1) of the Act, 21 U.S.C. section 360bbb-3(b)(1), unless the authorization is terminated or revoked.  Performed at [facility], [address], [place], [place] [address]   Urine Culture     Status: Abnormal (Preliminary result)   Collection Time: 07/15/22  7:39 AM   Specimen: Urine, Clean Catch  Result Value Ref Range Status   Specimen Description   Final    URINE, CLEAN CATCH Performed at [facility], [address], [place], [place] [address]    Special Requests   Final    NONE Performed at [facility], [address] [place], [place] [address]    Culture (A)  Final    >=100,000 COLONIES/mL ESCHERICHIA COLI CULTURE REINCUBATED FOR BETTER GROWTH Performed at [facility], [place] [address], [place], [place] [address]    Report Status PENDING  Incomplete   Organism ID, Bacteria ESCHERICHIA COLI (A)  Final      Susceptibility   Escherichia  coli - MIC*    AMPICILLIN >=32 RESISTANT Resistant     CEFAZOLIN >=64 RESISTANT Resistant     CEFEPIME <=0.12 SENSITIVE Sensitive     CEFTRIAXONE <=0.25 SENSITIVE Sensitive     CIPROFLOXACIN >=4 RESISTANT Resistant     GENTAMICIN <=1 SENSITIVE Sensitive     IMIPENEM <=0.25 SENSITIVE Sensitive     NITROFURANTOIN <=16 SENSITIVE Sensitive     TRIMETH/SULFA >=320 RESISTANT Resistant     AMPICILLIN/SULBACTAM >=32 RESISTANT Resistant     PIP/TAZO 64 INTERMEDIATE Intermediate     * >=100,000 COLONIES/mL ESCHERICHIA COLI  Culture, blood (Routine X 2) w Reflex to ID Panel     Status: None (Preliminary result)   Collection Time: 07/15/22 11:35 AM   Specimen: BLOOD  Result Value Ref Range Status   Specimen Description BLOOD BLOOD LEFT HAND  Final   Special Requests   Final    BOTTLES DRAWN AEROBIC ONLY Blood Culture results may not be optimal due to an inadequate volume of blood received in culture bottles   Culture   Final    NO GROWTH 2 DAYS Performed at [facility], [address], [place], [place] [address]    Report Status PENDING  Incomplete  C Difficile Quick Screen w PCR reflex     Status: None   Collection Time: 07/15/22  5:24 PM   Specimen: STOOL  Result Value Ref Range Status   C Diff antigen NEGATIVE NEGATIVE Final   C Diff toxin NEGATIVE NEGATIVE Final   C Diff interpretation No C. difficile detected.  Final    Comment: Performed at [facility], [address] [place], [place] [address]  Culture, blood (Routine X 2) w Reflex to ID  Panel     Status: None (Preliminary result)   Collection Time: 07/15/22  7:43 PM   Specimen: BLOOD  Result Value Ref Range Status   Specimen Description BLOOD RIGHT ANTECUBITAL  Final   Special Requests   Final    BOTTLES DRAWN AEROBIC AND ANAEROBIC Blood Culture adequate volume   Culture   Final    NO GROWTH 2 DAYS Performed at [facility], [address], [place], [place] [address]    Report Status PENDING   Incomplete    Coagulation Studies: No results for input(s): "LABPROT", "INR" in the last 72 hours.  Urinalysis: Recent Labs    07/15/22 0739  COLORURINE YELLOW*  LABSPEC 1.009  PHURINE 6.0  GLUCOSEU NEGATIVE  HGBUR LARGE*  BILIRUBINUR NEGATIVE  KETONESUR NEGATIVE  PROTEINUR 30*  NITRITE POSITIVE*  LEUKOCYTESUR LARGE*      Imaging: No results found.   Assessment & Plan: Kelli Shaffer is a 82 y.o.  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 Baseline creatinine appears to be 1.6 with GFR 37 on 03/05/22 (care everywhere). Creatinine on admission 3.39 but has returned to baseline. Adequate urine output 1.3L. We agree with the need for midline  placement.   2. Acute pyelonephritis from E-coli. Receiving Rocephin daily.     LOS: 2 Colon Flattery 1/11/20242:36 PM   Patient was examined and evaluated with Colon Flattery, NP.  Plan of care was formulated and discussed with NP.  I agree with the note as documented with edits.

## 2022-07-17 NOTE — Progress Notes (Addendum)
Mobility Specialist - Progress Note     07/17/22 0922  Mobility  Activity Ambulated with assistance in hallway;Stood at bedside  Level of Assistance Standby assist, set-up cues, supervision of patient - no hands on  Assistive Device Front wheel walker  Distance Ambulated (ft) 50 ft  Activity Response Tolerated well  Mobility Referral Yes  $Mobility charge 1 Mobility   Pt resting in bed on RA upon entry. Pt STS and ambulates in hallway around NS SBA. P T given VC to step closer to the walker, bring shoulders back and keep head up to maintain balance. Pt returned to recliner and left with needs in reach and bed alarm activated.   Loma Sender Mobility Specialist 07/17/22, 9:24 AM

## 2022-07-17 NOTE — Consult Note (Signed)
NAME: Kelli Shaffer  DOB: [birth year]  MRN: [id number]  Date/Time: 07/17/2022 1:53 PM  REQUESTING PROVIDER: Dr.Zhang Subjective:  REASON FOR CONSULT: UTI ? Kelli Shaffer is a 82 y.o. with a history of HTN, LVH, H/o meningiona s/p resection, early neurocognitive impairment presented to the ED on 07/14/22  with diarrhea, weakness of 3 days duration and falling off the chair. Pt says she started with diarrhea- Says she got a new home maker the same week but she is not sick nor is anyone in her family- One of her daughter has pneumonia but she has not seen her since christmas Pt did not have any fever at home She did not have dysuria or urgency or frequency- EMS report mentions abdominal pain but she denies any. No recent antibiotic use Diarrhea was quite frequent and watery- no blood But none now No nausea or vomiting In the ED vitals were  07/14/22  BP 104/53 !  Temp 99.3 F (37.4 C)  Pulse Rate 76  Resp 18  SpO2 95 %    Latest Reference Range & Units 07/14/22  WBC 4.0 - 10.5 K/uL 11.1 (H)  Hemoglobin 12.0 - 15.0 g/dL 10.1 (L)  HCT 36.0 - 46.0 % 31.4 (L)  Platelets 150 - 400 K/uL 142 (L)  Creatinine 0.44 - 1.00 mg/dL 3.39 (H)   BC sent UA > 50 WBC UC sent CT abdomen without contrast showed inflamed rt kidney and rt ureter  Past Medical History:  Diagnosis Date   Frequent UTI    Hypertension    LVH (left ventricular hypertrophy)    Meningioma (HCC)    Osteoarthritis     Past Surgical History:  Procedure Laterality Date   APPLICATION OF CRANIAL NAVIGATION Right 03/05/2021   Procedure: APPLICATION OF CRANIAL NAVIGATION;  Surgeon: Consuella Lose, MD;  Location: [facility];  Service: Neurosurgery;  Laterality: Right;   CESAREAN SECTION     CRANIOTOMY Right 03/05/2021   Procedure: STEREOTACTIC RIGHT FRONTAL CRANIOTOMY FOR RESECTION OF MENINGIOMA;  Surgeon: Consuella Lose, MD;  Location: [facility];  Service: Neurosurgery;  Laterality: Right;   HAND SURGERY Left    thumb    Social  History   Socioeconomic History   Marital status: Widowed    Spouse name: Not on file   Number of children: Not on file   Years of education: Not on file   Highest education level: Not on file  Occupational History   Not on file  Tobacco Use   Smoking status: Never   Smokeless tobacco: Never  Vaping Use   Vaping Use: Never used  Substance and Sexual Activity   Alcohol use: Never   Drug use: Never   Sexual activity: Not on file  Other Topics Concern   Not on file  Social History Narrative   Not on file   Social Determinants of Health   Financial Resource Strain: Not on file  Food Insecurity: No Food Insecurity (07/15/2022)   Hunger Vital Sign    Worried About Running Out of Food in the Last Year: Never true    Ran Out of Food in the Last Year: Never true  Transportation Needs: No Transportation Needs (07/15/2022)   PRAPARE - Hydrologist (Medical): No    Lack of Transportation (Non-Medical): No  Physical Activity: Not on file  Stress: Not on file  Social Connections: Not on file  Intimate Partner Violence: Not At Risk (07/15/2022)   Humiliation, Afraid, Rape, and Kick questionnaire  Fear of Current or Ex-Partner: No    Emotionally Abused: No    Physically Abused: No    Sexually Abused: No    Family History  Problem Relation Age of Onset   Hypertension Mother    Arthritis Father    Diabetes Daughter    Arthritis Daughter    Allergies  Allergen Reactions   Tilactase Other (See Comments) and Cough    Congestion, also   Penicillins Rash   I? Current Facility-Administered Medications  Medication Dose Route Frequency Provider Last Rate Last Admin   carvedilol (COREG) tablet 3.125 mg  3.125 mg Oral BID WC Sharen Hones, MD   3.125 mg at 07/17/22 8144   cefTRIAXone (ROCEPHIN) 2 g in sodium chloride 0.9 % 100 mL IVPB  2 g Intravenous Q24H Sharen Hones, MD 200 mL/hr at 07/17/22 0923 2 g at 07/17/22 0923   donepezil (ARICEPT) tablet 5 mg  5 mg  Oral Daily Sharen Hones, MD   5 mg at 07/17/22 8185   heparin injection 5,000 Units  5,000 Units Subcutaneous Q8H Dorothe Pea, RPH   5,000 Units at 07/17/22 0631   loperamide (IMODIUM) capsule 2 mg  2 mg Oral PRN Sharen Hones, MD   2 mg at 07/16/22 6314     Abtx:  Anti-infectives (From admission, onward)    Start     Dose/Rate Route Frequency Ordered Stop   07/16/22 1000  cefTRIAXone (ROCEPHIN) 2 g in sodium chloride 0.9 % 100 mL IVPB        2 g 200 mL/hr over 30 Minutes Intravenous Every 24 hours 07/15/22 1035     07/15/22 1045  cefTRIAXone (ROCEPHIN) 1 g in sodium chloride 0.9 % 100 mL IVPB        1 g 200 mL/hr over 30 Minutes Intravenous  Once 07/15/22 1037 07/15/22 1207   07/15/22 0615  cefTRIAXone (ROCEPHIN) 1 g in sodium chloride 0.9 % 100 mL IVPB        1 g 200 mL/hr over 30 Minutes Intravenous  Once 07/15/22 0614 07/15/22 0737       REVIEW OF SYSTEMS:  Const: fever in the hospital, negative chills, negative weight loss Eyes: negative diplopia or visual changes, negative eye pain ENT: negative coryza, negative sore throat Resp: negative cough, hemoptysis, dyspnea Cards: negative for chest pain, palpitations, lower extremity edema GU: negative for frequency, dysuria and hematuria GI: Negative for abdominal pain, ++diarrhea, no bleeding, constipation Skin: negative for rash and pruritus Heme: negative for easy bruising and gum/nose bleeding MS: weakness Neurolo:negative for headaches, dizziness, vertigo, memory problems  Psych: negative for feelings of anxiety, depression  Endocrine: negative for thyroid, diabetes Allergy/Immunology- as Glynis Smiles Including PCN - rash Objective:  VITALS:  Patient Vitals for the past 24 hrs:  BP Temp Temp src Pulse Resp SpO2  07/17/22 2013 (!) 144/77 99.1 F (37.3 C) Oral 83 19 95 %  07/17/22 1615 125/68 99.7 F (37.6 C) -- 80 16 97 %  07/17/22 0813 131/70 98 F (36.7 C) Oral 84 16 96 %  07/17/22 0515 (!) 146/84 98.5 F (36.9 C) --  85 18 100 %     PHYSICAL EXAM:  General: Alert, cooperative, no distress, appears young for age.  Head: Normocephalic, without obvious abnormality, atraumatic. Eyes: Conjunctivae clear, anicteric sclerae. Pupils are equal ENT Nares normal. No drainage or sinus tenderness. Lips, mucosa, and tongue normal. No Thrush Neck: Supple, symmetrical, no adenopathy, thyroid: non tender no carotid bruit and no JVD. Back: No CVA  tenderness. Lungs: Clear to auscultation bilaterally. No Wheezing or Rhonchi. No rales. Heart: Regular rate and rhythm, no murmur, rub or gallop. Abdomen: Soft, non-tender,not distended. Bowel sounds normal. No masses Extremities: atraumatic, no cyanosis. No edema. No clubbing Skin: No rashes or lesions. Or bruising Lymph: Cervical, supraclavicular normal. Neurologic: Grossly non-focal Pertinent Labs Lab Results CBC    Component Value Date/Time   WBC 12.1 (H) 07/16/2022 0431   RBC 3.14 (L) 07/16/2022 0431   HGB 9.6 (L) 07/16/2022 0431   HCT 29.5 (L) 07/16/2022 0431   PLT 154 07/16/2022 0431   MCV 93.9 07/16/2022 0431   MCH 30.6 07/16/2022 0431   MCHC 32.5 07/16/2022 0431   RDW 14.5 07/16/2022 0431   LYMPHSABS 2.3 10/16/2020 1806   MONOABS 0.6 10/16/2020 1806   EOSABS 0.2 10/16/2020 1806   BASOSABS 0.0 10/16/2020 1806       Latest Ref Rng & Units 07/17/2022    5:41 AM 07/16/2022    4:31 AM 07/14/2022    7:00 PM  CMP  Glucose 70 - 99 mg/dL 102  98  148   BUN 8 - 23 mg/dL 35  44  53   Creatinine 0.44 - 1.00 mg/dL 1.71  2.13  3.39   Sodium 135 - 145 mmol/L 137  139  134   Potassium 3.5 - 5.1 mmol/L 3.8  3.7  4.3   Chloride 98 - 111 mmol/L 108  111  107   CO2 22 - 32 mmol/L '22  19  19   '$ Calcium 8.9 - 10.3 mg/dL 9.1  8.8  9.3   Total Protein 6.5 - 8.1 g/dL   6.8   Total Bilirubin 0.3 - 1.2 mg/dL   0.5   Alkaline Phos 38 - 126 U/L   59   AST 15 - 41 U/L   32   ALT 0 - 44 U/L   23       Microbiology: Recent Results (from the past 240 hour(s))  Resp  panel by RT-PCR (RSV, Flu A&B, Covid) Anterior Nasal Swab     Status: None   Collection Time: 07/15/22  4:49 AM   Specimen: Anterior Nasal Swab  Result Value Ref Range Status   SARS Coronavirus 2 by RT PCR NEGATIVE NEGATIVE Final    Comment: (NOTE) SARS-CoV-2 target nucleic acids are NOT DETECTED.  The SARS-CoV-2 RNA is generally detectable in upper respiratory specimens during the acute phase of infection. The lowest concentration of SARS-CoV-2 viral copies this assay can detect is 138 copies/mL. A negative result does not preclude SARS-Cov-2 infection and should not be used as the sole basis for treatment or other patient management decisions. A negative result may occur with  improper specimen collection/handling, submission of specimen other than nasopharyngeal swab, presence of viral mutation(s) within the areas targeted by this assay, and inadequate number of viral copies(<138 copies/mL). A negative result must be combined with clinical observations, patient history, and epidemiological information. The expected result is Negative.  Fact Sheet for Patients:  EntrepreneurPulse.com.au  Fact Sheet for Healthcare Providers:  IncredibleEmployment.be  This test is no t yet approved or cleared by the [place] FDA and  has been authorized for detection and/or diagnosis of SARS-CoV-2 by FDA under an Emergency Use Authorization (EUA). This EUA will remain  in effect (meaning this test can be used) for the duration of the COVID-19 declaration under Section 564(b)(1) of the Act, 21 U.S.C.section 360bbb-3(b)(1), unless the authorization is terminated  or revoked sooner.  Influenza A by PCR NEGATIVE NEGATIVE Final   Influenza B by PCR NEGATIVE NEGATIVE Final    Comment: (NOTE) The Xpert Xpress SARS-CoV-2/FLU/RSV plus assay is intended as an aid in the diagnosis of influenza from Nasopharyngeal swab specimens and should not be used as a  sole basis for treatment. Nasal washings and aspirates are unacceptable for Xpert Xpress SARS-CoV-2/FLU/RSV testing.  Fact Sheet for Patients: EntrepreneurPulse.com.au  Fact Sheet for Healthcare Providers: IncredibleEmployment.be  This test is not yet approved or cleared by the [place] FDA and has been authorized for detection and/or diagnosis of SARS-CoV-2 by FDA under an Emergency Use Authorization (EUA). This EUA will remain in effect (meaning this test can be used) for the duration of the COVID-19 declaration under Section 564(b)(1) of the Act, 21 U.S.C. section 360bbb-3(b)(1), unless the authorization is terminated or revoked.     Resp Syncytial Virus by PCR NEGATIVE NEGATIVE Final    Comment: (NOTE) Fact Sheet for Patients: EntrepreneurPulse.com.au  Fact Sheet for Healthcare Providers: IncredibleEmployment.be  This test is not yet approved or cleared by the [place] FDA and has been authorized for detection and/or diagnosis of SARS-CoV-2 by FDA under an Emergency Use Authorization (EUA). This EUA will remain in effect (meaning this test can be used) for the duration of the COVID-19 declaration under Section 564(b)(1) of the Act, 21 U.S.C. section 360bbb-3(b)(1), unless the authorization is terminated or revoked.  Performed at [facility], [address], [place], [place] [address]   Urine Culture     Status: Abnormal (Preliminary result)   Collection Time: 07/15/22  7:39 AM   Specimen: Urine, Clean Catch  Result Value Ref Range Status   Specimen Description   Final    URINE, CLEAN CATCH Performed at [facility], [address] [place], [place] [address]    Special Requests   Final    NONE Performed at [facility], [address] [place], [place] [address]    Culture (A)  Final    >=100,000 COLONIES/mL ESCHERICHIA COLI CULTURE REINCUBATED FOR  BETTER GROWTH Performed at [facility], [place] [address] [place], [place] [address]    Report Status PENDING  Incomplete   Organism ID, Bacteria ESCHERICHIA COLI (A)  Final      Susceptibility   Escherichia coli - MIC*    AMPICILLIN >=32 RESISTANT Resistant     CEFAZOLIN >=64 RESISTANT Resistant     CEFEPIME <=0.12 SENSITIVE Sensitive     CEFTRIAXONE <=0.25 SENSITIVE Sensitive     CIPROFLOXACIN >=4 RESISTANT Resistant     GENTAMICIN <=1 SENSITIVE Sensitive     IMIPENEM <=0.25 SENSITIVE Sensitive     NITROFURANTOIN <=16 SENSITIVE Sensitive     TRIMETH/SULFA >=320 RESISTANT Resistant     AMPICILLIN/SULBACTAM >=32 RESISTANT Resistant     PIP/TAZO 64 INTERMEDIATE Intermediate     * >=100,000 COLONIES/mL ESCHERICHIA COLI  Culture, blood (Routine X 2) w Reflex to ID Panel     Status: None (Preliminary result)   Collection Time: 07/15/22 11:35 AM   Specimen: BLOOD  Result Value Ref Range Status   Specimen Description BLOOD BLOOD LEFT HAND  Final   Special Requests   Final    BOTTLES DRAWN AEROBIC ONLY Blood Culture results may not be optimal due to an inadequate volume of blood received in culture bottles   Culture   Final    NO GROWTH 2 DAYS Performed at [facility], [address], [place], [place] [address]  Report Status PENDING  Incomplete  C Difficile Quick Screen w PCR reflex     Status: None   Collection Time: 07/15/22  5:24 PM   Specimen: STOOL  Result Value Ref Range Status   C Diff antigen NEGATIVE NEGATIVE Final   C Diff toxin NEGATIVE NEGATIVE Final   C Diff interpretation No C. difficile detected.  Final    Comment: Performed at [facility], [address] [place], [place] [address]  Culture, blood (Routine X 2) w Reflex to ID Panel     Status: None (Preliminary result)   Collection Time: 07/15/22  7:43 PM   Specimen: BLOOD  Result Value Ref Range Status   Specimen Description BLOOD RIGHT ANTECUBITAL  Final   Special Requests   Final     BOTTLES DRAWN AEROBIC AND ANAEROBIC Blood Culture adequate volume   Culture   Final    NO GROWTH 2 DAYS Performed at [facility], [address], [place], [place] [address]    Report Status PENDING  Incomplete    IMAGING RESULTS: CT abdomen reviewed I have personally reviewed the films ? Impression/Recommendation ?82 yr female presenting with diarrhea and weakness Found to have fever No urinary symptoms   CT abdomen showed rt sided pyelonephritis. Is this a true UTI? At first blush it looked like a viral illness but neg Flu/covid Pt has ecoli in urine culture and resistant to a few antibiotics No PO options available Bladder scan no residue On ceftriaxone- day 3 If she is afebrile for 24 hrs we may be able to stop all antibiotics DC loperamide- if diarrhea recurs check GI panel PCR Cdiff negative   AKI on CKD- improving  HTN - management as per primary team ? Resected meningioma Neurocog impairment- on Aricept  ? ___________________________________________________ Discussed with patient, and care team Note:  This document was prepared using Dragon voice recognition software and may include unintentional dictation errors.

## 2022-07-18 DIAGNOSIS — E872 Acidosis, unspecified: Secondary | ICD-10-CM | POA: Diagnosis not present

## 2022-07-18 DIAGNOSIS — N170 Acute kidney failure with tubular necrosis: Secondary | ICD-10-CM | POA: Diagnosis not present

## 2022-07-18 DIAGNOSIS — N1832 Chronic kidney disease, stage 3b: Secondary | ICD-10-CM | POA: Diagnosis not present

## 2022-07-18 DIAGNOSIS — N10 Acute tubulo-interstitial nephritis: Secondary | ICD-10-CM | POA: Diagnosis not present

## 2022-07-18 LAB — URINE CULTURE: Culture: >=100000 — AB

## 2022-07-18 LAB — BASIC METABOLIC PANEL
Anion gap: 8 (ref 5–15)
BUN: 28 mg/dL — ABNORMAL HIGH (ref 8–23)
CO2: 23 mmol/L (ref 22–32)
Calcium: 9.3 mg/dL (ref 8.9–10.3)
Chloride: 109 mmol/L (ref 98–111)
Creatinine, Ser: 1.57 mg/dL — ABNORMAL HIGH (ref 0.44–1.00)
GFR, Estimated: 33 mL/min — ABNORMAL LOW (ref >60)
Glucose, Bld: 108 mg/dL — ABNORMAL HIGH (ref 70–99)
Potassium: 4 mmol/L (ref 3.5–5.1)
Sodium: 140 mmol/L (ref 135–145)

## 2022-07-18 MED ORDER — CARVEDILOL 25 MG PO TABS: 12.5000 mg | ORAL_TABLET | Freq: Two times a day (BID) | ORAL | Status: AC

## 2022-07-18 NOTE — Progress Notes (Signed)
Mobility Specialist - Progress Note    07/18/22 1357  Mobility  Activity Ambulated with assistance in room  Level of Assistance Standby assist, set-up cues, supervision of patient - no hands on  Assistive Device Front wheel walker  Distance Ambulated (ft) 3 ft  Activity Response Tolerated well  Mobility Referral Yes  $Mobility charge 1 Mobility   Pt resting in chair upon entry on RA. Pt changed her mind about being in the chair and wanted to get back in bed. Pt STS and ambulated with RW (4 step turn pivot and side stepped to [facility]). Pt returned to bed and left with needs in reach and bed alarm activated.   Loma Sender Mobility Specialist 07/18/22, 2:00 PM

## 2022-07-18 NOTE — Care Management Important Message (Signed)
Important Message  Patient Details  Name: Kelli Shaffer MRN: [id number] Date of Birth: [birth year]   Medicare Important Message Given:  Yes     [name] 07/18/2022, 11:12 AM

## 2022-07-18 NOTE — Discharge Summary (Signed)
Physician Discharge Summary   Patient: Kelli Shaffer MRN: [id number] DOB: [birth year]  Admit date:     07/15/2022  Discharge date: 07/18/22  Discharge Physician: Sharen Hones   PCP: Ebbie Ridge, MD   Recommendations at discharge:   Follow-up with PCP in 1 week. Hold diuretics for now, may consider restart after seen by PCP.  Discharge Diagnoses: Principal Problem:   Acute pyelonephritis Active Problems:   HTN (hypertension)   Acute renal failure superimposed on stage 3b chronic kidney disease (HCC)   Hyponatremia   Thrombocytopenia (HCC)   Obesity (BMI 30-39.9)   Metabolic acidosis  Resolved Problems:   * No resolved hospital problems. [facility] Course: Kelli Shaffer is a 82 y.o. female with medical history significant of essential hypertension, left ventricular hypertrophy, GERD, came to the hospital with complaints of general weakness and diarrhea.  She also complaining of subjective fever.  CT scan showed evidence of pyelonephritis without hydronephrosis.  She was started on Rocephin.  She also received IV fluids for acute renal failure. 1/11.  Renal function improved, IV fluids discontinued.  However, patient urine culture came back with E. coli, no oral antibiotic option.  ID consult obtained. Patient condition improved, discussed with ID, no need for additional antibiotics after today's dose if no fever in 24 hours.  Patient did not have fever for the distal 36 hours now.  At this point, she is medically stable to be discharged. Assessment and Plan: Acute pyelonephritis due to E. coli. Patient presents with diarrhea as a symptom of pyelonephritis.  UA grossly abnormal, urine culture pending. CT scan showed evidence of pyelonephritis. Urine culture came back with E. coli, no oral antibiotic options.  Discussed with ID, patient has completed antibiotics today.  No need for oral antibiotics.  Patient is advised to follow-up with PCP as outpatient.   Acute renal failure on  chronic kidney stage IIIb secondary to diarrhea. Hyponatremia. Metabolic acidosis Based on lab results from a month ago from [facility], patient renal function much worse. This is secondary to diarrhea with dehydration. Patient is C diff toxin was negative.   Renal function close to baseline, metabolic acidosis improved.     Essential hypertension. Resume Coreg, but reduce the dose, may restart normal dose if blood pressure still high after next office visit.   Obesity BMI 37.90. Diet and exercise advised.    Anemia of chronic disease. Iron appears to be adequate, B12 level elevated Hb is stable.       Consultants: Nephrology Procedures performed: None  Disposition: Home health Diet recommendation:  Discharge Diet Orders (From admission, onward)     Start     Ordered   07/18/22 0000  Diet - low sodium heart healthy        07/18/22 1037           Cardiac diet DISCHARGE MEDICATION: Allergies as of 07/18/2022       Reactions   Tilactase Other (See Comments), Cough   Congestion, also   Penicillins Rash        Medication List     STOP taking these medications    hydrochlorothiazide 25 MG tablet Commonly known as: HYDRODIURIL   spironolactone 50 MG tablet Commonly known as: ALDACTONE   trospium 20 MG tablet Commonly known as: SANCTURA       TAKE these medications    acetaminophen 500 MG tablet Commonly known as: TYLENOL Take 1,000 mg by mouth daily as needed (for pain).   carvedilol 25  MG tablet Commonly known as: COREG Take 0.5 tablets (12.5 mg total) by mouth 2 (two) times daily with a meal. What changed: how much to take   donepezil 10 MG tablet Commonly known as: ARICEPT Take 10 mg by mouth daily.   fluticasone 50 MCG/ACT nasal spray Commonly known as: FLONASE Place 1-2 sprays into both nostrils daily.   loratadine 10 MG tablet Commonly known as: CLARITIN Take 10 mg by mouth daily.   One-A-Day Proactive 65+ Tabs Take 1 tablet  by mouth daily with breakfast.   traMADol 50 MG tablet Commonly known as: ULTRAM Take 25 mg by mouth every 8 (eight) hours as needed (for pain).               Durable Medical Equipment  (From admission, onward)           Start     Ordered   07/16/22 1521  For home use only DME Walker rolling  Once       Question Answer Comment  Walker: With 5 Inch Wheels   Patient needs a walker to treat with the following condition Acute pyelonephritis      07/16/22 1520            Follow-up Information     Ebbie Ridge, MD Follow up in 1 week(s).   Specialty: Cardiology Contact information: [address] [place] [address] [phone]                Discharge Exam: Danley Danker Weights   07/15/22 0828  Weight: 91 kg   General exam: Appears calm and comfortable  Respiratory system: Clear to auscultation. Respiratory effort normal. Cardiovascular system: S1 & S2 heard, RRR. No JVD, murmurs, rubs, gallops or clicks. No pedal edema. Gastrointestinal system: Abdomen is nondistended, soft and nontender. No organomegaly or masses felt. Normal bowel sounds heard. Central nervous system: Alert and oriented. No focal neurological deficits. Extremities: Symmetric 5 x 5 power. Skin: No rashes, lesions or ulcers Psychiatry: Judgement and insight appear normal. Mood & affect appropriate.    Condition at discharge: good  The results of significant diagnostics from this hospitalization (including imaging, microbiology, ancillary and laboratory) are listed below for reference.   Imaging Studies: CT ABDOMEN PELVIS WO CONTRAST  Result Date: 07/15/2022 CLINICAL DATA:  82 year old female with acute abdominal pain. EXAM: CT ABDOMEN AND PELVIS WITHOUT CONTRAST TECHNIQUE: Multidetector CT imaging of the abdomen and pelvis was performed following the standard protocol without IV contrast. RADIATION DOSE REDUCTION: This exam was performed according to the departmental  dose-optimization program which includes automated exposure control, adjustment of the mA and/or kV according to patient size and/or use of iterative reconstruction technique. COMPARISON:  Noncontrast CT Abdomen and Pelvis 05/21/2021. FINDINGS: Lower chest: Heart size remains normal. No pericardial or pleural effusion. Negative lung bases. Hepatobiliary: Stable and negative noncontrast liver and gallbladder. Pancreas: Stable and negative noncontrast pancreas. Spleen: Negative. Adrenals/Urinary Tract: Stable and negative noncontrast adrenal glands. Noncontrast left kidney appears stable since 2022. No convincing obstruction or urinary calculus on that side. But the noncontrast right kidney today appears asymmetrically indistinct, inflamed. See coronal image 48. Still, no obvious hydronephrosis. No nephrolithiasis identified. Proximal right ureter might be inflamed but is nondilated. Chronic pelvic phleboliths. Diminutive urinary bladder. No perivesical inflammation. No obvious right ureteral calculus. Stomach/Bowel: Decompressed rectum. Some oral contrast or other dense enteric material present, and has reached the rectum. Descending and sigmoid diverticulosis is extensive. But no active inflammation is identified. Redundant  transverse colon with mild to moderate diverticulosis. Cecum is on a lax mesentery located in the central abdomen today. The cecum is mildly distended with gas, but the appendix remains normal tracking laterally from the cecum on coronal image 28. Decompressed terminal ileum. No convincing large bowel inflammation. No dilated small bowel. Possible small gastric hiatal hernia or phrenic ampulla (normal variant). Small volume fluid in the stomach. Relatively decompressed duodenum. No free air or free fluid identified. Vascular/Lymphatic: Aortoiliac calcified atherosclerosis. Vascular patency is not evaluated in the absence of IV contrast. Normal caliber abdominal aorta. No lymphadenopathy  identified. Reproductive: Noncontrast appearance stable and within normal limits. Other: No pelvic free fluid.  Chronic pelvic phleboliths. Musculoskeletal: Chronic lumbar spine degeneration including L3-L4 spondylolisthesis, facet arthropathy, and up to severe multifactorial spinal stenosis there. Stable asymmetric left femoral head and acetabular degenerative spurring and/or dystrophic calcification. No acute osseous abnormality identified. IMPRESSION: 1. Noncontrast Right kidney appears inflamed although not obviously obstructed. Consider Acute Pyelonephritis, or ascending UTI. CT abdomen with IV contrast may evaluate further. No definite urinary calculus. 2. No other acute or inflammatory process identified in the noncontrast abdomen or pelvis. Normal appendix. Extensive diverticulosis of the large bowel but no active inflammation identified. 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 Aortic Atherosclerosis (ICD10-I70.0). Electronically Signed   By: Genevie Ann M.D.   On: 07/15/2022 05:00    Microbiology: Results for orders placed or performed during the hospital encounter of 07/15/22  Resp panel by RT-PCR (RSV, Flu A&B, Covid) Anterior Nasal Swab     Status: None   Collection Time: 07/15/22  4:49 AM   Specimen: Anterior Nasal Swab  Result Value Ref Range Status   SARS Coronavirus 2 by RT PCR NEGATIVE NEGATIVE Final    Comment: (NOTE) SARS-CoV-2 target nucleic acids are NOT DETECTED.  The SARS-CoV-2 RNA is generally detectable in upper respiratory specimens during the acute phase of infection. The lowest concentration of SARS-CoV-2 viral copies this assay can detect is 138 copies/mL. A negative result does not preclude SARS-Cov-2 infection and should not be used as the sole basis for treatment or other patient management decisions. A negative result may occur with  improper specimen collection/handling, submission of specimen other than nasopharyngeal swab, presence of  viral mutation(s) within the areas targeted by this assay, and inadequate number of viral copies(<138 copies/mL). A negative result must be combined with clinical observations, patient history, and epidemiological information. The expected result is Negative.  Fact Sheet for Patients:  EntrepreneurPulse.com.au  Fact Sheet for Healthcare Providers:  IncredibleEmployment.be  This test is no t yet approved or cleared by the [place] FDA and  has been authorized for detection and/or diagnosis of SARS-CoV-2 by FDA under an Emergency Use Authorization (EUA). This EUA will remain  in effect (meaning this test can be used) for the duration of the COVID-19 declaration under Section 564(b)(1) of the Act, 21 U.S.C.section 360bbb-3(b)(1), unless the authorization is terminated  or revoked sooner.       Influenza A by PCR NEGATIVE NEGATIVE Final   Influenza B by PCR NEGATIVE NEGATIVE Final    Comment: (NOTE) The Xpert Xpress SARS-CoV-2/FLU/RSV plus assay is intended as an aid in the diagnosis of influenza from Nasopharyngeal swab specimens and should not be used as a sole basis for treatment. Nasal washings and aspirates are unacceptable for Xpert Xpress SARS-CoV-2/FLU/RSV testing.  Fact Sheet for Patients: EntrepreneurPulse.com.au  Fact Sheet for Healthcare Providers: IncredibleEmployment.be  This test is not yet approved or cleared by  the [employer] and has been authorized for detection and/or diagnosis of SARS-CoV-2 by FDA under an Emergency Use Authorization (EUA). This EUA will remain in effect (meaning this test can be used) for the duration of the COVID-19 declaration under Section 564(b)(1) of the Act, 21 U.S.C. section 360bbb-3(b)(1), unless the authorization is terminated or revoked.     Resp Syncytial Virus by PCR NEGATIVE NEGATIVE Final    Comment: (NOTE) Fact Sheet for  Patients: EntrepreneurPulse.com.au  Fact Sheet for Healthcare Providers: IncredibleEmployment.be  This test is not yet approved or cleared by the [place] FDA and has been authorized for detection and/or diagnosis of SARS-CoV-2 by FDA under an Emergency Use Authorization (EUA). This EUA will remain in effect (meaning this test can be used) for the duration of the COVID-19 declaration under Section 564(b)(1) of the Act, 21 U.S.C. section 360bbb-3(b)(1), unless the authorization is terminated or revoked.  Performed at [facility], [address], [place], [place] [address]   Urine Culture     Status: Abnormal   Collection Time: 07/15/22  7:39 AM   Specimen: Urine, Clean Catch  Result Value Ref Range Status   Specimen Description   Final    URINE, CLEAN CATCH Performed at [facility], [address], [place], [place] [address]    Special Requests   Final    NONE Performed at [facility], [address] [place], [place] [address]    Culture >=100,000 COLONIES/mL ESCHERICHIA COLI (A)  Final   Report Status 07/18/2022 FINAL  Final   Organism ID, Bacteria ESCHERICHIA COLI (A)  Final      Susceptibility   Escherichia coli - MIC*    AMPICILLIN >=32 RESISTANT Resistant     CEFAZOLIN >=64 RESISTANT Resistant     CEFEPIME <=0.12 SENSITIVE Sensitive     CEFTRIAXONE <=0.25 SENSITIVE Sensitive     CIPROFLOXACIN >=4 RESISTANT Resistant     GENTAMICIN <=1 SENSITIVE Sensitive     IMIPENEM <=0.25 SENSITIVE Sensitive     NITROFURANTOIN <=16 SENSITIVE Sensitive     TRIMETH/SULFA >=320 RESISTANT Resistant     AMPICILLIN/SULBACTAM >=32 RESISTANT Resistant     PIP/TAZO 64 INTERMEDIATE Intermediate     * >=100,000 COLONIES/mL ESCHERICHIA COLI  Culture, blood (Routine X 2) w Reflex to ID Panel     Status: None (Preliminary result)   Collection Time: 07/15/22 11:35 AM   Specimen: BLOOD  Result Value Ref Range Status    Specimen Description BLOOD BLOOD LEFT HAND  Final   Special Requests   Final    BOTTLES DRAWN AEROBIC ONLY Blood Culture results may not be optimal due to an inadequate volume of blood received in culture bottles   Culture   Final    NO GROWTH 3 DAYS Performed at [facility], [address], [place], [place] [address]    Report Status PENDING  Incomplete  C Difficile Quick Screen w PCR reflex     Status: None   Collection Time: 07/15/22  5:24 PM   Specimen: STOOL  Result Value Ref Range Status   C Diff antigen NEGATIVE NEGATIVE Final   C Diff toxin NEGATIVE NEGATIVE Final   C Diff interpretation No C. difficile detected.  Final    Comment: Performed at [facility], [address] [place], [place] [address]  Culture, blood (Routine X 2) w Reflex to ID Panel     Status: None (Preliminary result)   Collection Time: 07/15/22  7:43 PM  Specimen: BLOOD  Result Value Ref Range Status   Specimen Description BLOOD RIGHT ANTECUBITAL  Final   Special Requests   Final    BOTTLES DRAWN AEROBIC AND ANAEROBIC Blood Culture adequate volume   Culture   Final    NO GROWTH 3 DAYS Performed at [facility], [address] [place], [place] [address]    Report Status PENDING  Incomplete    Labs: CBC: Recent Labs  Lab 07/14/22 1715 07/16/22 0431  WBC 11.1* 12.1*  HGB 10.1* 9.6*  HCT 31.4* 29.5*  MCV 97.2 93.9  PLT 142* 026   Basic Metabolic Panel: Recent Labs  Lab 07/14/22 1900 07/16/22 0431 07/17/22 0541 07/18/22 0605  NA 134* 139 137 140  K 4.3 3.7 3.8 4.0  CL 107 111 108 109  CO2 19* 19* 22 23  GLUCOSE 148* 98 102* 108*  BUN 53* 44* 35* 28*  CREATININE 3.39* 2.13* 1.71* 1.57*  CALCIUM 9.3 8.8* 9.1 9.3  MG  --  2.0 1.8  --    Liver Function Tests: Recent Labs  Lab 07/14/22 1900  AST 32  ALT 23  ALKPHOS 59  BILITOT 0.5  PROT 6.8  ALBUMIN 2.9*   CBG: No results for input(s): "GLUCAP" in the last 168 hours.  Discharge time spent:  greater than 30 minutes.  Signed: Sharen Hones, MD [facility] 07/18/2022

## 2022-07-18 NOTE — Progress Notes (Signed)
Mobility Specialist - Progress Note    07/18/22 1354  Mobility  Activity Ambulated with assistance in hallway;Stood at bedside  Level of Assistance Standby assist, set-up cues, supervision of patient - no hands on  Assistive Device Front wheel walker  Distance Ambulated (ft) 45 ft  Activity Response Tolerated well  Mobility Referral Yes  $Mobility charge 1 Mobility   Pt resting in chair upon entry on RA. Pt STS and ambulates to hallway SBA with RW. Pt expressed feeling fatigue and returned to chair. Pt alarm activated and needs in reach.   Loma Sender Mobility Specialist 07/18/22, 1:57 PM

## 2022-07-18 NOTE — Progress Notes (Signed)
Mobility Specialist - Progress Note     07/18/22 1352  Mobility  Activity Ambulated with assistance to bathroom;Stood at bedside  Level of Assistance Standby assist, set-up cues, supervision of patient - no hands on  Assistive Device Front wheel walker  Distance Ambulated (ft) 10 ft  Activity Response Tolerated well  Mobility Referral Yes  $Mobility charge 1 Mobility   Pt resting in bed on RA upon entry. Pt STS and ambulates to bathroom SBA with RW. Pt returned to chair and left with needs in reach and chair alarm activated.   Loma Sender Mobility Specialist 07/18/22, 1:54 PM

## 2022-07-18 NOTE — TOC Transition Note (Addendum)
Transition of Care [facility]) - CM/SW Discharge Note   Patient Details  Name: Kelli Shaffer MRN: [id number] Date of Birth: [birth year]  Transition of Care [facility]) CM/SW Contact:  Gerilyn Pilgrim, LCSW Phone Number: 07/18/2022, 10:46 AM   Clinical Narrative:   MD states patient no longer needs IV antibiotics. Meg with [facility] notified and has accepted for PT. Rhonda with [facility] notified that patient needs a rollator delivered to her room. CSW signing off.   12:30pm Pt declined need for rollator as is not covered by insurance.     Final next level of care: Home w Home Health Services Barriers to Discharge: Barriers Resolved   Patient Goals and CMS Choice CMS Medicare.gov Compare Post Acute Care list provided to:: Patient Represenative (must comment) (daughter priscilla) Choice offered to / list presented to : Adult Children  Discharge Placement                         Discharge Plan and Services Additional resources added to the After Visit Summary for                            [facility] Arranged: OT, PT [facility] Agency: Travis Date [facility] Agency Contacted: 07/17/22 Time HH Agency Contacted: 1000 Representative spoke with at [place]: Meg  Social Determinants of Health ([place]) Interventions SDOH Screenings   Food Insecurity: No Food Insecurity (07/15/2022)  Housing: Low Risk  (07/15/2022)  Transportation Needs: No Transportation Needs (07/15/2022)  Utilities: Not At Risk (07/15/2022)  Tobacco Use: Low Risk  (07/15/2022)     Readmission Risk Interventions     No data to display

## 2022-07-20 LAB — CULTURE, BLOOD (ROUTINE X 2)
Culture: NO GROWTH
Culture: NO GROWTH
Special Requests: ADEQUATE

## 2022-09-26 ENCOUNTER — Other Ambulatory Visit: Payer: Self-pay | Admitting: Urology

## 2022-09-26 DIAGNOSIS — N3281 Overactive bladder: Secondary | ICD-10-CM

## 2022-10-14 IMAGING — MR MR HEAD WO/W CM
9 of 13 series · 34 of 48 positions shown · IV contrast (Yes   MULTIHANCE)
Comparison: MRI December 25, 2020.

CLINICAL DATA: Brain/CNS neoplasm, assess treatment response

EXAM:
MRI HEAD WITHOUT AND WITH CONTRAST
TECHNIQUE: Multiplanar, multiecho pulse sequences of the brain and surrounding
structures were obtained without and with intravenous contrast.
CONTRAST:  8mL GADAVIST GADOBUTROL 1 MMOL/ML IV SOLN

[Series 3: DWI · axial · 3.0mm · 1.09mm/px · z∈[-86,+72]mm · 9 of 108 slices shown (1 of 4)]
[im 1/108]
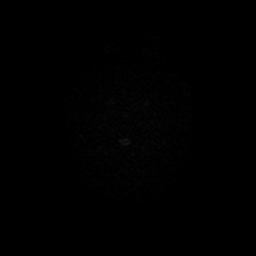
[im 14/108]
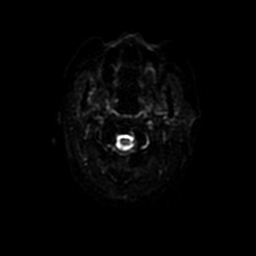
[im 27/108]
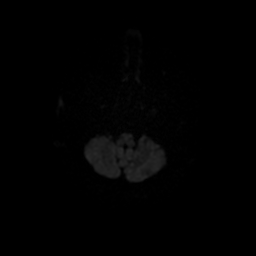
[im 41/108]
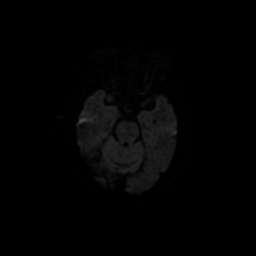
[im 54/108]
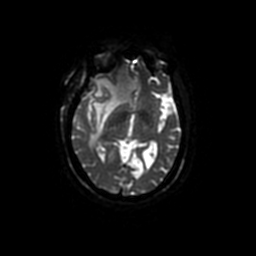
[im 67/108]
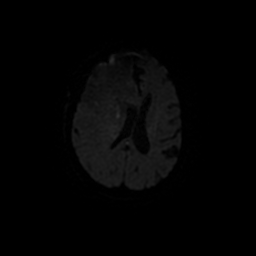
[im 81/108]
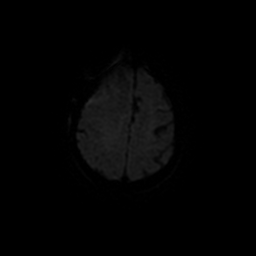
[im 94/108]
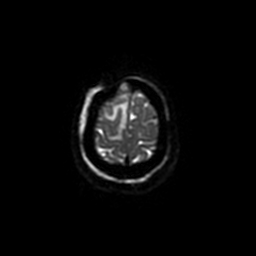
[im 108/108]
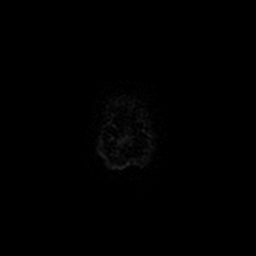

[Series 4: DWI · coronal · 5.0mm · 1.09mm/px · 6 of 76 slices shown (2 of 4)]
[im 1/76]
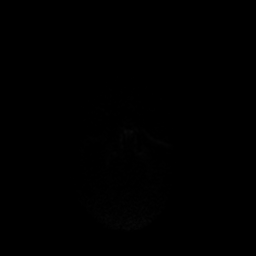
[im 16/76]
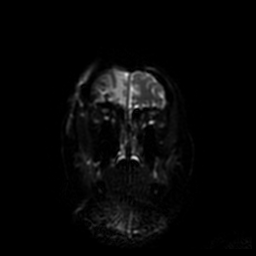
[im 31/76]
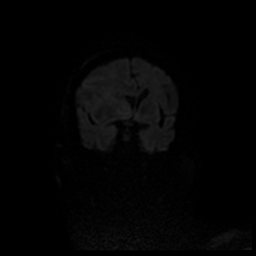
[im 46/76]
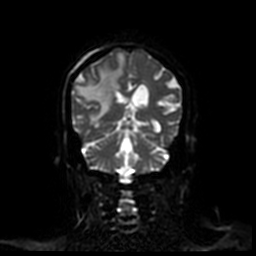
[im 61/76]
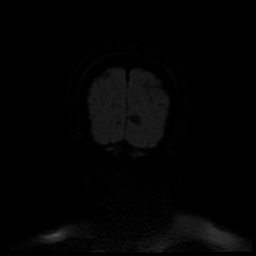
[im 76/76]
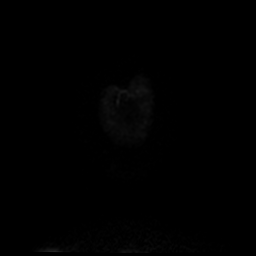

[Series 6: T2 · axial · 5.0mm · 0.43mm/px · z∈[-79,+70]mm · 2 of 26 slices shown]
[im 1/26]
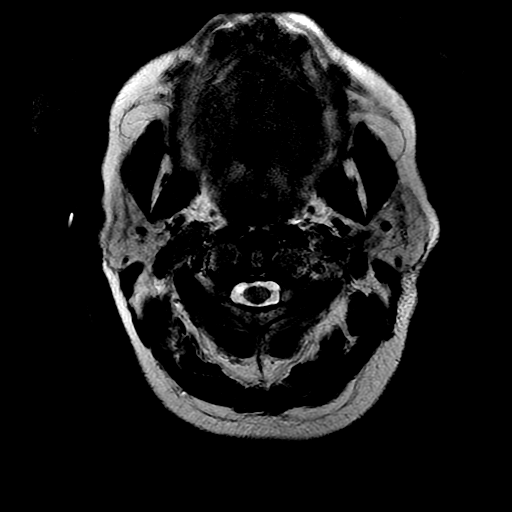
[im 26/26]
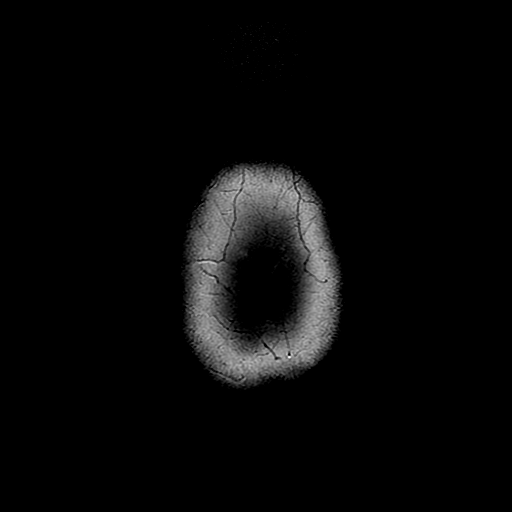

[Series 7: FLAIR · axial · 3.0mm · 0.43mm/px · z∈[-79,+70]mm · 2 of 26 slices shown]
[im 1/26]
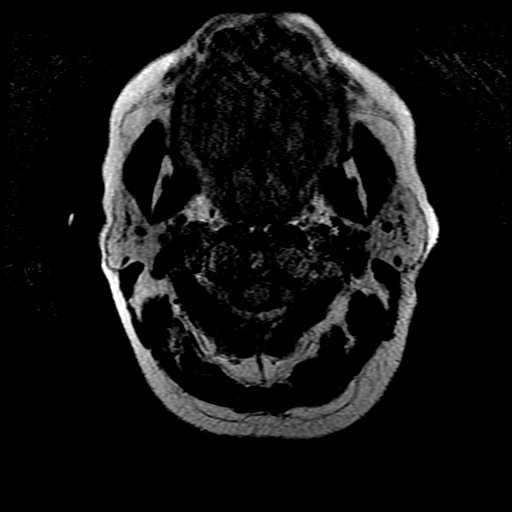
[im 26/26]
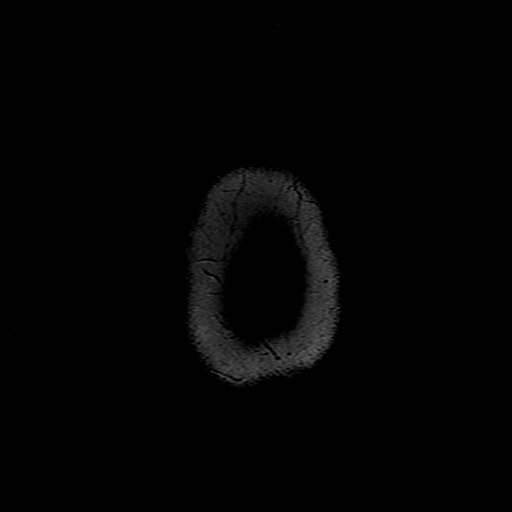

[Series 11: T1 post-contrast · axial · 3.0mm · 0.43mm/px · z∈[-80,+72]mm · 4 of 52 slices shown (1 of 3)]
[im 1/52]
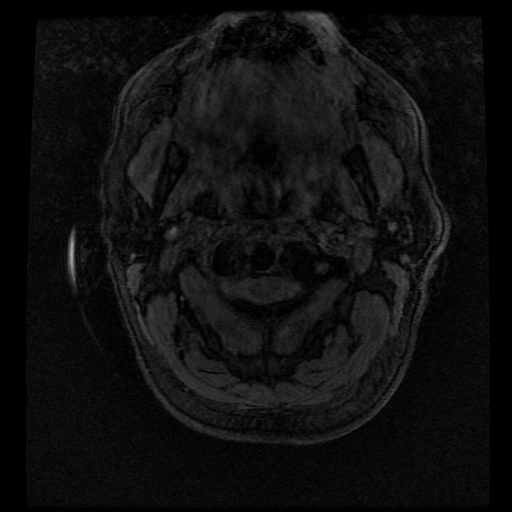
[im 18/52]
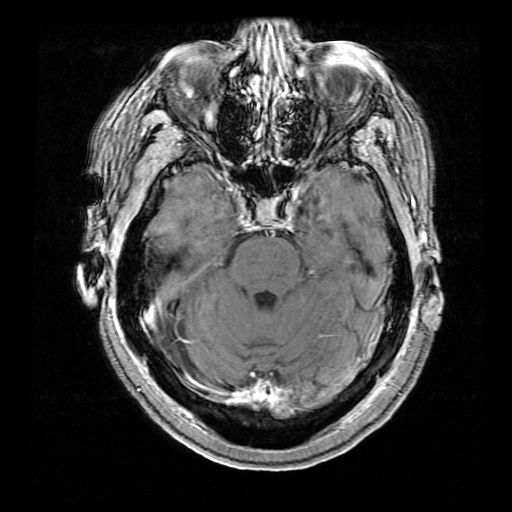
[im 35/52]
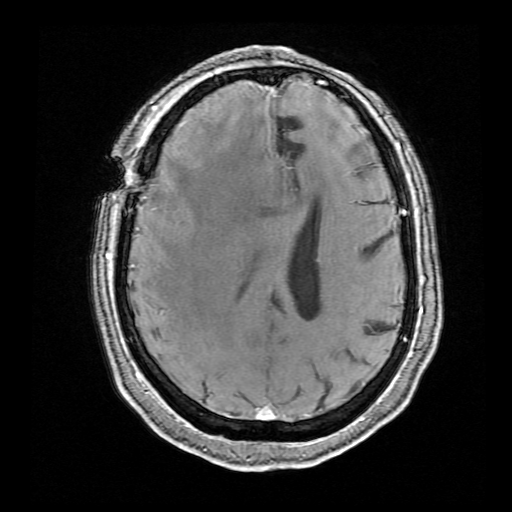
[im 52/52]
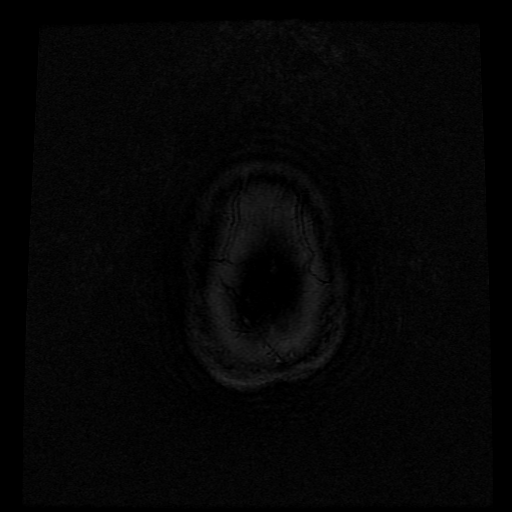

[Series 12: T1 post-contrast · coronal · 5.0mm · 0.39mm/px · 2 of 27 slices shown (2 of 3)]
[im 1/27]
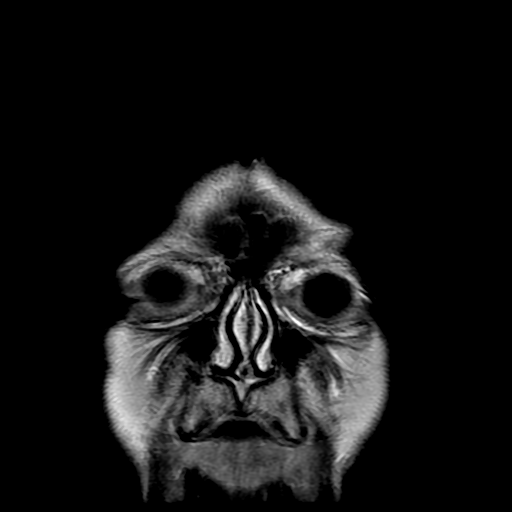
[im 27/27]
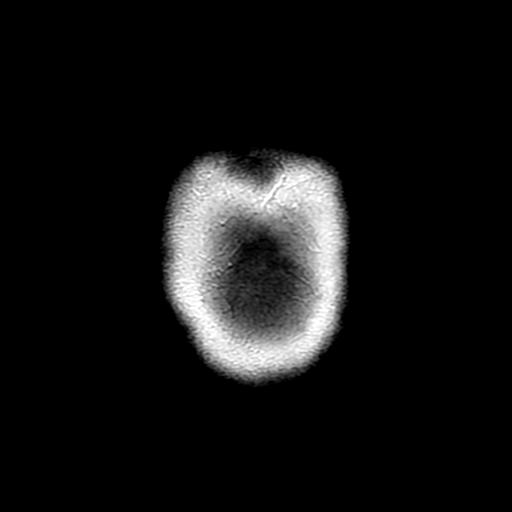

[Series 13: T1 post-contrast · sagittal · 5.0mm · 0.47mm/px · 2 of 25 slices shown (3 of 3)]
[im 1/25]
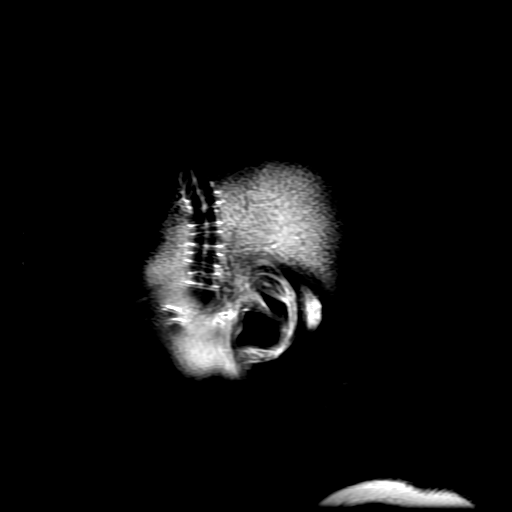
[im 25/25]
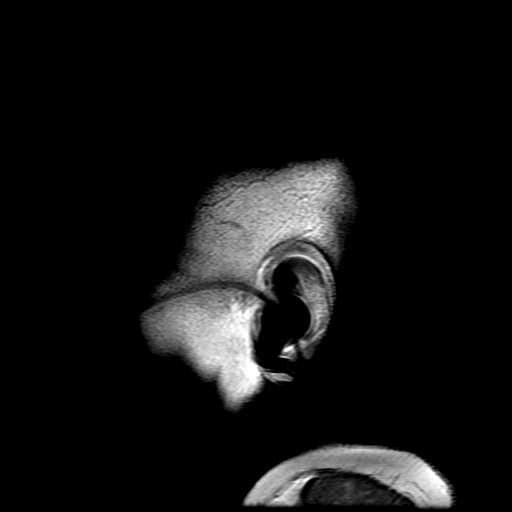

[Series 300: DWI · axial · 3.0mm · 1.09mm/px · z∈[-86,+72]mm · 4 of 54 slices shown (3 of 4)]
[im 1/54]
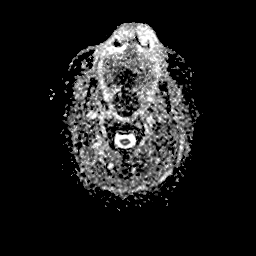
[im 18/54]
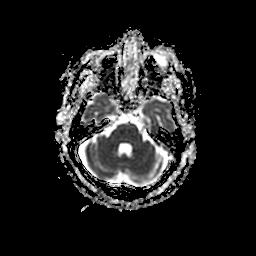
[im 36/54]
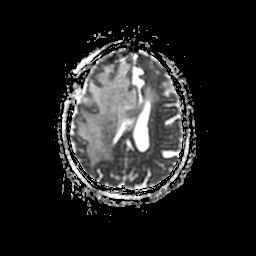
[im 54/54]
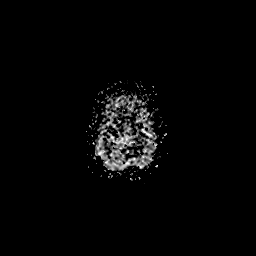

[Series 400: DWI · coronal · 5.0mm · 1.09mm/px · 3 of 37 slices shown (4 of 4)]
[im 1/37]
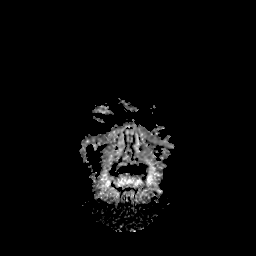
[im 19/37]
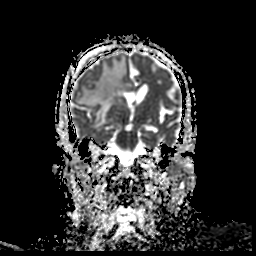
[im 37/37]
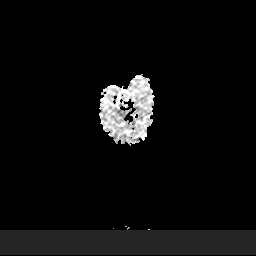

[34 of 48 positions shown; findings below may reference images not displayed]

FINDINGS: Brain: Interval right frontal craniotomy resection of a putative
meningioma. There is only minimal linear enhancement at resection
site without nodular or masslike to suggest residual tumor. Mild
devascularized tissue and hemorrhage at the resection site. There is
similar extensive vasogenic edema in the right hemisphere crossing
the corpus callosum into the left frontal lobe. Similar effacement
the right lateral ventricle. No hydrocephalus. Multiple small acute
infarcts in the right basal ganglia and overlying corona radiata.
Additional punctate areas of possible restricted diffusion in the
posterior limb of the left internal capsule (series 3, image 27) and
possibly the right pons (series 3, image 19)

Multiple punctate acute infarcts in the right basal ganglia.

Vascular: Major arterial flow voids are maintained at the skull
base.

Skull and upper cervical spine: Normal marrow signal.

Sinuses/Orbits: Mild ethmoid air cell mucosal thickening.
Unremarkable orbits.

Other: No mastoid effusions.
IMPRESSION: 1. Multiple small acute infarcts in the right basal ganglia and
overlying corona radiata.
2. Possible additional punctate infarcts in the left posterior limb
of the internal capsule and right pons versus artifact.
3. Interval resection of the putative right frontal meningioma
without nodular/masslike enhancement to suggest residual tumor.
Similar extensive vasogenic edema in the right hemisphere crossing
to the left frontal lobe with similar 10 mm of leftward midline
shift.

These results will be called to the ordering clinician or
representative by the Radiologist Assistant, and communication
documented in the PACS or [REDACTED].

## 2023-04-08 ENCOUNTER — Other Ambulatory Visit: Payer: Self-pay | Admitting: Infectious Diseases

## 2023-04-08 DIAGNOSIS — R29898 Other symptoms and signs involving the musculoskeletal system: Secondary | ICD-10-CM

## 2023-04-08 DIAGNOSIS — M79645 Pain in left finger(s): Secondary | ICD-10-CM

## 2023-04-15 ENCOUNTER — Ambulatory Visit: Payer: Medicare Other

## 2023-04-29 ENCOUNTER — Ambulatory Visit
Admission: RE | Admit: 2023-04-29 | Discharge: 2023-04-29 | Disposition: A | Discharge status: Home / Self Care | Payer: Medicare Other | Source: Ambulatory Visit | Attending: Infectious Diseases | Admitting: Infectious Diseases

## 2023-04-29 DIAGNOSIS — M79645 Pain in left finger(s): Secondary | ICD-10-CM | POA: Insufficient documentation

## 2023-04-29 DIAGNOSIS — R29898 Other symptoms and signs involving the musculoskeletal system: Secondary | ICD-10-CM | POA: Diagnosis present

## 2023-05-05 ENCOUNTER — Encounter: Payer: Self-pay | Admitting: Occupational Therapy

## 2023-05-05 ENCOUNTER — Ambulatory Visit: Payer: Medicare Other | Attending: Orthopedic Surgery | Admitting: Occupational Therapy

## 2023-05-05 DIAGNOSIS — M6281 Muscle weakness (generalized): Secondary | ICD-10-CM | POA: Insufficient documentation

## 2023-05-05 NOTE — Therapy (Signed)
OUTPATIENT OCCUPATIONAL THERAPY ORTHO EVALUATION  Patient Name: Kelli Shaffer MRN: [id number] DOB:Aug 09, 1940, 82 y.o., female Today's Date: 05/05/2023  PCP: Dr Kelli Shaffer PROVIDER: Dr Kelli Shaffer  END OF SESSION:  OT End of Session - 05/05/23 1313     Visit Number 1    Number of Visits 4    Date for OT Re-Evaluation 06/16/23    OT Start Time 1203    OT Stop Time 1238    OT Time Calculation (min) 35 min    Activity Tolerance Patient tolerated treatment well    Behavior During Therapy WFL for tasks assessed/performed             Past Medical History:  Diagnosis Date   Frequent UTI    Hypertension    LVH (left ventricular hypertrophy)    Meningioma (HCC)    Osteoarthritis    Past Surgical History:  Procedure Laterality Date   APPLICATION OF CRANIAL NAVIGATION Right 03/05/2021   Procedure: APPLICATION OF CRANIAL NAVIGATION;  Surgeon: Kelli Renshaw, MD;  Location: [facility] OR;  Service: Neurosurgery;  Laterality: Right;   CESAREAN SECTION     CRANIOTOMY Right 03/05/2021   Procedure: STEREOTACTIC RIGHT FRONTAL CRANIOTOMY FOR RESECTION OF MENINGIOMA;  Surgeon: Kelli Renshaw, MD;  Location: [facility] OR;  Service: Neurosurgery;  Laterality: Right;   HAND SURGERY Left    thumb   Patient Active Problem List   Diagnosis Date Noted   Acute pyelonephritis 07/15/2022   Acute renal failure superimposed on stage 3b chronic kidney disease (HCC) 07/15/2022   Hyponatremia 07/15/2022   Thrombocytopenia (HCC) 07/15/2022   Obesity (BMI 30-39.9) 07/15/2022   Metabolic acidosis 07/15/2022   Lymphedema 11/14/2021   Difficulty walking 04/16/2021   Expressive aphasia 04/16/2021   History of meningioma 04/16/2021   Imbalance 04/16/2021   Loss of memory 04/16/2021   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   DDD (degenerative disc disease), lumbosacral 02/27/2021   Preoperative evaluation of a medical condition to rule out surgical contraindications (TAR required) 11/08/2020    Colon adenoma 04/24/2020   Meningioma (HCC) 04/28/2019   Primary osteoarthritis of both hips 02/17/2017   GERD without esophagitis 11/22/2015   LVH (left ventricular hypertrophy) 01/17/2013   Systolic murmur 12/01/2012   HTN (hypertension) 10/13/2012   OA (osteoarthritis) 10/13/2012   Sciatica 10/13/2012    ONSET DATE: 03/31/23  REFERRING DIAG: L hand weakness   THERAPY DIAG:  Muscle weakness (generalized)  Rationale for Evaluation and Treatment: Rehabilitation  SUBJECTIVE:   SUBJECTIVE STATEMENT: Symptoms started on 03/31/23 with pain in neck down the L arm and hand - weakness after that in middle thru pinkie and numbness -cannot open my hand - hard time eating , bathing and dressing- numbness side of hand and pinkie -Had CT last Thursday - see my PCP Friday  Pt accompanied by: daughter  PERTINENT HISTORY:  DR Kelli Shaffer note on  04/27/23:   Kelli Shaffer is a 82 y.o. female here today.   The patient presents with a chief complaint of left hand weakness and flexion deformity of the middle, ring, and little fingers, with a possible recent cerebrovascular accident (CVA). A CT scan is pending. She is accompanied by an adult female.  She has been experiencing difficulty straightening her fingers since the end of 03/2023. The accompanying female states the patient's fingers were completely closed at the time of the incident at the end of 03/2023. Despite being able to make a fist, she is unable to spread her fingers apart. She reports no numbness;  however, the accompanying female states the patient did note numbness. The patient has had prior surgery on the left hand.   She had a consultation with a neurologist last week due to significant discomfort and was taking tramadol. The neurologist recommended taking tramadol 3 times a day; however, on the day of the CT scan, she was unable to wake up, leading her to discontinue tramadol. The CT scan has been rescheduled. She has not consulted a hand  therapist.   The patient lives in [place].  REFER TO OT for splinting  PRECAUTIONS: None     WEIGHT BEARING RESTRICTIONS: No  PAIN:  Are you having pain? No  FALLS: Has patient fallen in last 6 months? No  LIVING ENVIRONMENT: Lives with: lives with their family Lives in: House/apartment  PLOF: prior to 03/31/23 could use L hand in eating , dressing and bathing   PATIENT GOALS: Get a splint to prevent a contracture of my hand-see if and get more motion in my fingers  NEXT MD VISIT: PCP this Friday  OBJECTIVE:  Note: Objective measures were completed at Evaluation unless otherwise noted.  HAND DOMINANCE: Right  UPPER EXTREMITY ROM:     Active ROM Right eval Left eval  Shoulder flexion    Shoulder abduction    Shoulder adduction    Shoulder extension    Shoulder internal rotation    Shoulder external rotation    Elbow flexion  WNL  Elbow extension  WNL  Wrist flexion  82  Wrist extension  55  Wrist ulnar deviation  25  Wrist radial deviation  20  Wrist pronation  90  Wrist supination  85  (Blank rows = not tested)  Active ROM Right eval Left eval  Thumb MCP (0-60)    Thumb IP (0-80)    Thumb Radial abd/add (0-55)     Thumb Palmar abd/add (0-45)     Thumb Opposition to Small Finger     Index MCP (0-90)  Ext -45   Index PIP (0-100)     Index DIP (0-70)      Long MCP (0-90)   Ext -65   Long PIP (0-100)      Long DIP (0-70)      Ring MCP (0-90)   Ext -60   Ring PIP (0-100)      Ring DIP (0-70)      Little MCP (0-90)   Ext -65   Little PIP (0-100)      Little DIP (0-70)      (Blank rows = not tested)   HAND FUNCTION: NT  COORDINATION: Decrease - cannot ext 3rd thru 5th digits  SENSATION: Numbness report on ulnar side of hand into fifth digit  EDEMA: None noticed  COGNITION: Overall cognitive status: Within functional limits for tasks assessed Daughter report patient starting to get some dementia    TODAY'S TREATMENT:  DATE: 05/05/23 Patient was evaluated today for a resting hand splint.  Recommend a prefab comfy splint measurements taken and info sent to [facility]. They will check insurance will contact patient and daughter. Provided home exercises for daughter and patient to do 2-3 times a day Wrist extension stretch followed by attempts for maintaining digit extension rolling over foam roller or gliding of edge of table. Active assisted range of motion for ulnar radial deviation on a paper or hands together.  12 reps     PATIENT EDUCATION: Education details: Findings evaluation and info about splint and home exercises Person educated: Patient and Child(ren) Education method: Explanation, Demonstration, Tactile cues, Verbal cues, and Handouts Education comprehension: verbalized understanding, returned demonstration, verbal cues required, and needs further education   GOALS: Goals reviewed with patient? Yes  SHORT TERM GOALS: Target date: 6 wks  Patient to be fitted with appropriate splint to maintain flexion contractures in left hand to wear at nighttime. Baseline: No splint at this time unable to extend metacarpals 3rd through 5th digit on left hand actively; has full passive range of motion Goal status: INITIAL  LONG TERM GOALS: Target date: 6 wks  Patient daughter show independence in donning and doffing as well as wearing of appropriate splint with no issues to prevent flexion contracture in left hand and possible increase digit extension Baseline: Patient unable to extend 3rd through 5th digits on left hand out of metacarpals with a -45 to -65 degrees- no knowledge of splint wearing Goal status: INITIAL      ASSESSMENT:  CLINICAL IMPRESSION: Patient iwas seen today for occupational therapy evaluation for left hand weakness with decreased 3rd through 5th digits  extension mostly out of metacarpals.  Patient report numbness on ulnar side of hand into fifth digit.  Patient with full wrist flexion and extension as well as ulnar radial deviation and supination pronation.  Patient and daughter reports symptoms started about 9/24.  Patient had a CT done last week and has a follow-up with PCP this Friday.  Patient was referred by Dr. Rosita Shaffer for a resting hand splint to prevent flexor contracture.  Patient will be benefiting from a prefab comfy splint.  Info sent to [facility].  Patient and daughter to contact me when getting the splint to be fitted and educated in wearing appropriately as well as possible home exercises to maintain or improve motion.  Patient can benefit from skilled OT services for splint fitting to prevent contracture and motion in left hand. PERFORMANCE DEFICITS: in functional skills including ADLs, IADLs, sensation, ROM, strength, flexibility, and UE functional use,   IMPAIRMENTS: are limiting patient from ADLs, IADLs, rest and sleep, play, and leisure.   COMORBIDITIES: may have co-morbidities  that affects occupational performance. Patient will benefit from skilled OT to address above impairments and improve overall function.  MODIFICATION OR ASSISTANCE TO COMPLETE EVALUATION: Min-Moderate modification of tasks or assist with assess necessary to complete an evaluation.  OT OCCUPATIONAL PROFILE AND HISTORY: Problem focused assessment: Including review of records relating to presenting problem.  CLINICAL DECISION MAKING: LOW - limited treatment options, no task modification necessary  REHAB POTENTIAL: Fair depends if CVA or cervical compression  EVALUATION COMPLEXITY: Low      PLAN:  OT FREQUENCY: 3 visits  OT DURATION: 6 weeks  PLANNED INTERVENTIONS: 97535 self care/ADL training, 13086 therapeutic exercise, 97760 Splinting (initial encounter), passive range of motion, and DME and/or AE instructions    CONSULTED AND AGREED  WITH PLAN OF CARE: Patient  Oletta Cohn, OTR/L,CLT 05/05/2023, 5:17 PM

## 2023-05-12 ENCOUNTER — Other Ambulatory Visit: Payer: Self-pay | Admitting: Student

## 2023-05-12 DIAGNOSIS — R29898 Other symptoms and signs involving the musculoskeletal system: Secondary | ICD-10-CM

## 2023-05-12 DIAGNOSIS — M5412 Radiculopathy, cervical region: Secondary | ICD-10-CM

## 2023-05-12 DIAGNOSIS — M79602 Pain in left arm: Secondary | ICD-10-CM

## 2023-05-12 DIAGNOSIS — M542 Cervicalgia: Secondary | ICD-10-CM

## 2023-05-27 ENCOUNTER — Ambulatory Visit: Payer: Medicare Other

## 2023-08-12 ENCOUNTER — Other Ambulatory Visit
Admission: RE | Admit: 2023-08-12 | Discharge: 2023-08-12 | Disposition: A | Discharge status: Home / Self Care | Payer: Medicare Other | Source: Ambulatory Visit | Attending: Infectious Diseases | Admitting: Infectious Diseases

## 2023-08-12 DIAGNOSIS — R600 Localized edema: Secondary | ICD-10-CM | POA: Diagnosis present

## 2023-08-12 LAB — BRAIN NATRIURETIC PEPTIDE: B Natriuretic Peptide: 15.6 pg/mL (ref 0.0–100.0)

## 2023-10-05 ENCOUNTER — Encounter: Attending: Internal Medicine | Admitting: Internal Medicine

## 2023-10-05 DIAGNOSIS — S80811D Abrasion, right lower leg, subsequent encounter: Secondary | ICD-10-CM | POA: Insufficient documentation

## 2023-10-05 DIAGNOSIS — N1832 Chronic kidney disease, stage 3b: Secondary | ICD-10-CM | POA: Diagnosis not present

## 2023-10-05 DIAGNOSIS — I70211 Atherosclerosis of native arteries of extremities with intermittent claudication, right leg: Secondary | ICD-10-CM | POA: Insufficient documentation

## 2023-10-05 DIAGNOSIS — X58XXXD Exposure to other specified factors, subsequent encounter: Secondary | ICD-10-CM | POA: Diagnosis not present

## 2023-10-08 ENCOUNTER — Other Ambulatory Visit (INDEPENDENT_AMBULATORY_CARE_PROVIDER_SITE_OTHER): Payer: Self-pay | Admitting: Internal Medicine

## 2023-10-08 DIAGNOSIS — I70211 Atherosclerosis of native arteries of extremities with intermittent claudication, right leg: Secondary | ICD-10-CM

## 2023-10-09 ENCOUNTER — Ambulatory Visit (INDEPENDENT_AMBULATORY_CARE_PROVIDER_SITE_OTHER)

## 2023-10-09 DIAGNOSIS — I70211 Atherosclerosis of native arteries of extremities with intermittent claudication, right leg: Secondary | ICD-10-CM

## 2023-10-12 LAB — VAS US ABI WITH/WO TBI
Left ABI: 0.96
Right ABI: 0.8

## 2023-10-13 ENCOUNTER — Encounter: Attending: Physician Assistant | Admitting: Physician Assistant

## 2023-10-13 DIAGNOSIS — N1832 Chronic kidney disease, stage 3b: Secondary | ICD-10-CM | POA: Insufficient documentation

## 2023-10-13 DIAGNOSIS — Z87891 Personal history of nicotine dependence: Secondary | ICD-10-CM | POA: Insufficient documentation

## 2023-10-13 DIAGNOSIS — I70211 Atherosclerosis of native arteries of extremities with intermittent claudication, right leg: Secondary | ICD-10-CM | POA: Diagnosis not present

## 2023-10-13 DIAGNOSIS — W19XXXA Unspecified fall, initial encounter: Secondary | ICD-10-CM | POA: Diagnosis not present

## 2023-10-13 DIAGNOSIS — S80811A Abrasion, right lower leg, initial encounter: Secondary | ICD-10-CM | POA: Diagnosis present

## 2023-10-20 ENCOUNTER — Encounter: Admitting: Physician Assistant

## 2023-10-20 DIAGNOSIS — S80811A Abrasion, right lower leg, initial encounter: Secondary | ICD-10-CM | POA: Diagnosis not present

## 2023-10-27 ENCOUNTER — Encounter: Admitting: Physician Assistant

## 2023-10-27 DIAGNOSIS — S80811A Abrasion, right lower leg, initial encounter: Secondary | ICD-10-CM | POA: Diagnosis not present

## 2023-11-03 ENCOUNTER — Encounter: Admitting: Physician Assistant

## 2023-11-03 DIAGNOSIS — S80811A Abrasion, right lower leg, initial encounter: Secondary | ICD-10-CM | POA: Diagnosis not present

## 2023-11-10 ENCOUNTER — Encounter: Attending: Physician Assistant | Admitting: Physician Assistant

## 2023-11-10 DIAGNOSIS — N1832 Chronic kidney disease, stage 3b: Secondary | ICD-10-CM | POA: Diagnosis not present

## 2023-11-10 DIAGNOSIS — I70211 Atherosclerosis of native arteries of extremities with intermittent claudication, right leg: Secondary | ICD-10-CM | POA: Insufficient documentation

## 2023-11-10 DIAGNOSIS — L97818 Non-pressure chronic ulcer of other part of right lower leg with other specified severity: Secondary | ICD-10-CM | POA: Insufficient documentation

## 2023-11-11 ENCOUNTER — Ambulatory Visit: Admitting: Physician Assistant

## 2023-11-17 ENCOUNTER — Encounter: Admitting: Physician Assistant

## 2023-11-17 DIAGNOSIS — I70211 Atherosclerosis of native arteries of extremities with intermittent claudication, right leg: Secondary | ICD-10-CM | POA: Diagnosis not present

## 2023-11-19 ENCOUNTER — Ambulatory Visit (INDEPENDENT_AMBULATORY_CARE_PROVIDER_SITE_OTHER): Admitting: Nurse Practitioner

## 2023-11-19 VITALS — BP 133/71 | HR 56 | Resp 17

## 2023-11-19 DIAGNOSIS — I7025 Atherosclerosis of native arteries of other extremities with ulceration: Secondary | ICD-10-CM | POA: Diagnosis not present

## 2023-11-19 DIAGNOSIS — I10 Essential (primary) hypertension: Secondary | ICD-10-CM

## 2023-11-23 ENCOUNTER — Encounter (INDEPENDENT_AMBULATORY_CARE_PROVIDER_SITE_OTHER): Payer: Self-pay | Admitting: Nurse Practitioner

## 2023-11-23 NOTE — Progress Notes (Signed)
 Subjective:    Patient ID: Kelli Shaffer, female    DOB: 1940/10/04, 83 y.o.   MRN: [id number] Chief Complaint  Patient presents with   Venous Insufficiency    The patient is seen for evaluation of painful lower extremities and diminished pulses associated with ulceration of the foot.  The patient notes the ulcer has been present for multiple weeks and has not been improving.  It is very painful and has had some drainage.  No specific history of trauma noted by the patient.  The patient denies fever or chills.  the patient does have diabetes which has been difficult to control.  She currently has a wound on the right foot that is being treated by wound care.  The patient denies rest pain or dangling of an extremity off the side of the bed during the night for relief. No prior interventions or surgeries.  No history of back problems or DJD of the lumbar sacral spine.   The patient denies amaurosis fugax or recent TIA symptoms. There are no recent neurological changes noted. The patient denies history of DVT, PE or superficial thrombophlebitis. The patient denies recent episodes of angina or shortness of breath.   The patient has an ABI of 0.80 on the right and 0.96 on the left there is also a TBI of 0.41 on the left.    Review of Systems  Skin:  Positive for wound.  All other systems reviewed and are negative.      Objective:    Physical Exam Vitals reviewed.  HENT:     Head: Normocephalic.  Cardiovascular:     Rate and Rhythm: Normal rate.     Pulses:          Dorsalis pedis pulses are detected w/ Doppler on the right side and detected w/ Doppler on the left side.       Posterior tibial pulses are detected w/ Doppler on the right side and detected w/ Doppler on the left side.  Pulmonary:     Effort: Pulmonary effort is normal.  Skin:    General: Skin is warm and dry.  Neurological:     Mental Status: She is alert and oriented to person, place, and time.  Psychiatric:         Mood and Affect: Mood normal.        Behavior: Behavior normal.        Thought Content: Thought content normal.        Judgment: Judgment normal.     BP 133/71 (BP Location: Right Arm, Patient Position: Sitting, Cuff Size: Large)   Pulse (!) 56   Resp 17   Past Medical History:  Diagnosis Date   Frequent UTI    Hypertension    LVH (left ventricular hypertrophy)    Meningioma (HCC)    Osteoarthritis     Social History   Socioeconomic History   Marital status: Widowed    Spouse name: Not on file   Number of children: Not on file   Years of education: Not on file   Highest education level: Not on file  Occupational History   Not on file  Tobacco Use   Smoking status: Never   Smokeless tobacco: Never  Vaping Use   Vaping status: Never Used  Substance and Sexual Activity   Alcohol use: Never   Drug use: Never   Sexual activity: Not on file  Other Topics Concern   Not on file  Social History Narrative  Not on file   Social Drivers of Health   Financial Resource Strain: Low Risk  (08/18/2023)   Received from [facility]   Overall Financial Resource Strain (CARDIA)    Difficulty of Paying Living Expenses: Not hard at all  Food Insecurity: No Food Insecurity (08/18/2023)   Received from [facility]   Hunger Vital Sign    Worried About Running Out of Food in the Last Year: Never true    Ran Out of Food in the Last Year: Never true  Transportation Needs: No Transportation Needs (08/18/2023)   Received from [facility] - Transportation    In the past 12 months, has lack of transportation kept you from medical appointments or from getting medications?: No    Lack of Transportation (Non-Medical): No  Physical Activity: Not on file  Stress: Not on file  Social Connections: Not on file  Intimate Partner Violence: Not At Risk (07/15/2022)   Humiliation, Afraid, Rape, and Kick questionnaire    Fear of  Current or Ex-Partner: No    Emotionally Abused: No    Physically Abused: No    Sexually Abused: No    Past Surgical History:  Procedure Laterality Date   APPLICATION OF CRANIAL NAVIGATION Right 03/05/2021   Procedure: APPLICATION OF CRANIAL NAVIGATION;  Surgeon: Augusto Blonder, MD;  Location: [facility] OR;  Service: Neurosurgery;  Laterality: Right;   CESAREAN SECTION     CRANIOTOMY Right 03/05/2021   Procedure: STEREOTACTIC RIGHT FRONTAL CRANIOTOMY FOR RESECTION OF MENINGIOMA;  Surgeon: Augusto Blonder, MD;  Location: [facility] OR;  Service: Neurosurgery;  Laterality: Right;   HAND SURGERY Left    thumb    Family History  Problem Relation Age of Onset   Hypertension Mother    Arthritis Father    Diabetes Daughter    Arthritis Daughter     Allergies  Allergen Reactions   Tilactase Other (See Comments) and Cough    Congestion, also   Penicillins Rash       Latest Ref Rng & Units 07/16/2022    4:31 AM 07/14/2022    5:15 PM 03/05/2021    8:39 AM  CBC  WBC 4.0 - 10.5 K/uL 12.1  11.1    Hemoglobin 12.0 - 15.0 g/dL 9.6  98.1  9.5   Hematocrit 36.0 - 46.0 % 29.5  31.4  28.0   Platelets 150 - 400 K/uL 154  142         CMP     Component Value Date/Time   NA 140 07/18/2022 0605   K 4.0 07/18/2022 0605   CL 109 07/18/2022 0605   CO2 23 07/18/2022 0605   GLUCOSE 108 (H) 07/18/2022 0605   BUN 28 (H) 07/18/2022 0605   CREATININE 1.57 (H) 07/18/2022 0605   CALCIUM 9.3 07/18/2022 0605   PROT 6.8 07/14/2022 1900   ALBUMIN  2.9 (L) 07/14/2022 1900   AST 32 07/14/2022 1900   ALT 23 07/14/2022 1900   ALKPHOS 59 07/14/2022 1900   BILITOT 0.5 07/14/2022 1900   GFRNONAA 33 (L) 07/18/2022 0605     VAS US  ABI WITH/WO TBI Result Date: 10/12/2023  LOWER EXTREMITY DOPPLER STUDY Patient Name:  Kelli Shaffer  Date of Exam:   10/09/2023 Medical Rec #: [id number]   Accession #:    [id number] Date of Birth: 11-23-1940   Patient Gender: F Patient Age:   49 years Exam Location:  [facility]  Procedure:  VAS US  ABI WITH/WO TBI Referring Phys: Rosebud Confer --------------------------------------------------------------------------------  Indications: Abnormal ABI at other facility.  Performing Technologist: Tonie Franks RVS  Examination Guidelines: A complete evaluation includes at minimum, Doppler waveform signals and systolic blood pressure reading at the level of bilateral brachial, anterior tibial, and posterior tibial arteries, when vessel segments are accessible. Bilateral testing is considered an integral part of a complete examination. Photoelectric Plethysmograph (PPG) waveforms and toe systolic pressure readings are included as required and additional duplex testing as needed. Limited examinations for reoccurring indications may be performed as noted.  ABI Findings: +---------+------------------+-----+--------+--------+ Right    Rt Pressure (mmHg)IndexWaveformComment  +---------+------------------+-----+--------+--------+ Brachial 141                                     +---------+------------------+-----+--------+--------+ ATA      107               0.70 biphasic         +---------+------------------+-----+--------+--------+ PTA      121               0.80 biphasic         +---------+------------------+-----+--------+--------+ Great Toe63                0.41 Abnormal         +---------+------------------+-----+--------+--------+ +---------+------------------+-----+---------+-------+ Left     Lt Pressure (mmHg)IndexWaveform Comment +---------+------------------+-----+---------+-------+ Brachial 152                                     +---------+------------------+-----+---------+-------+ ATA      132               0.87 biphasic         +---------+------------------+-----+---------+-------+ PTA      146               0.96 triphasic        +---------+------------------+-----+---------+-------+ Great Toe166               1.09 Normal            +---------+------------------+-----+---------+-------+ +-------+-----------+-----------+------------+------------+ ABI/TBIToday's ABIToday's TBIPrevious ABIPrevious TBI +-------+-----------+-----------+------------+------------+ Right  .80        .41                                 +-------+-----------+-----------+------------+------------+ Left   .96        1.09                                +-------+-----------+-----------+------------+------------+  Summary: Right: Resting right ankle-brachial index indicates mild right lower extremity arterial disease. The right toe-brachial index is abnormal. Left: Resting left ankle-brachial index is within normal range. The left toe-brachial index is normal. *See table(s) above for measurements and observations.  Electronically signed by [name] MD on 10/12/2023 at 4:54:09 PM.    Final (Updated)        Assessment & Plan:   1. Atherosclerosis of native arteries of the extremities with ulceration (HCC) (Primary)  Recommend:  The patient has evidence of severe atherosclerotic changes of both lower extremities associated with ulceration and tissue loss of the right foot.  This represents a limb threatening ischemia and places the patient at the  risk for right limb loss.  Patient should undergo angiography of the right lower extremity with the hope for intervention for limb salvage.  The risks and benefits as well as the alternative therapies was discussed in detail with the patient.  All questions were answered.  Patient agrees to proceed with right angiography.  The patient will follow up with me in the office after the procedure.   2. Primary hypertension Continue antihypertensive medications as already ordered, these medications have been reviewed and there are no changes at this time.   Current Outpatient Medications on File Prior to Visit  Medication Sig Dispense Refill   acetaminophen  (TYLENOL ) 500 MG tablet Take 1,000 mg  by mouth daily as needed (for pain).     carvedilol  (COREG ) 25 MG tablet Take 0.5 tablets (12.5 mg total) by mouth 2 (two) times daily with a meal.     donepezil  (ARICEPT ) 10 MG tablet Take 10 mg by mouth daily.     fluticasone  (FLONASE ) 50 MCG/ACT nasal spray Place 1-2 sprays into both nostrils daily.     loratadine  (CLARITIN ) 10 MG tablet Take 10 mg by mouth daily.     Multiple Vitamins-Minerals (ONE-A-DAY PROACTIVE 65+) TABS Take 1 tablet by mouth daily with breakfast.     pregabalin (LYRICA) 25 MG capsule Take 25 mg by mouth 2 (two) times daily.     traMADol (ULTRAM) 50 MG tablet Take 25 mg by mouth every 8 (eight) hours as needed (for pain).     traZODone (DESYREL) 50 MG tablet Take 100 mg by mouth at bedtime as needed.     No current facility-administered medications on file prior to visit.    There are no Patient Instructions on file for this visit. No follow-ups on file.   Choua Chalker E Annica Marinello, NP

## 2023-11-23 NOTE — H&P (View-Only) (Signed)
 Subjective:    Patient ID: Kelli Shaffer, female    DOB: 1940/10/04, 83 y.o.   MRN: [id number] Chief Complaint  Patient presents with   Venous Insufficiency    The patient is seen for evaluation of painful lower extremities and diminished pulses associated with ulceration of the foot.  The patient notes the ulcer has been present for multiple weeks and has not been improving.  It is very painful and has had some drainage.  No specific history of trauma noted by the patient.  The patient denies fever or chills.  the patient does have diabetes which has been difficult to control.  She currently has a wound on the right foot that is being treated by wound care.  The patient denies rest pain or dangling of an extremity off the side of the bed during the night for relief. No prior interventions or surgeries.  No history of back problems or DJD of the lumbar sacral spine.   The patient denies amaurosis fugax or recent TIA symptoms. There are no recent neurological changes noted. The patient denies history of DVT, PE or superficial thrombophlebitis. The patient denies recent episodes of angina or shortness of breath.   The patient has an ABI of 0.80 on the right and 0.96 on the left there is also a TBI of 0.41 on the left.    Review of Systems  Skin:  Positive for wound.  All other systems reviewed and are negative.      Objective:    Physical Exam Vitals reviewed.  HENT:     Head: Normocephalic.  Cardiovascular:     Rate and Rhythm: Normal rate.     Pulses:          Dorsalis pedis pulses are detected w/ Doppler on the right side and detected w/ Doppler on the left side.       Posterior tibial pulses are detected w/ Doppler on the right side and detected w/ Doppler on the left side.  Pulmonary:     Effort: Pulmonary effort is normal.  Skin:    General: Skin is warm and dry.  Neurological:     Mental Status: She is alert and oriented to person, place, and time.  Psychiatric:         Mood and Affect: Mood normal.        Behavior: Behavior normal.        Thought Content: Thought content normal.        Judgment: Judgment normal.     BP 133/71 (BP Location: Right Arm, Patient Position: Sitting, Cuff Size: Large)   Pulse (!) 56   Resp 17   Past Medical History:  Diagnosis Date   Frequent UTI    Hypertension    LVH (left ventricular hypertrophy)    Meningioma (HCC)    Osteoarthritis     Social History   Socioeconomic History   Marital status: Widowed    Spouse name: Not on file   Number of children: Not on file   Years of education: Not on file   Highest education level: Not on file  Occupational History   Not on file  Tobacco Use   Smoking status: Never   Smokeless tobacco: Never  Vaping Use   Vaping status: Never Used  Substance and Sexual Activity   Alcohol use: Never   Drug use: Never   Sexual activity: Not on file  Other Topics Concern   Not on file  Social History Narrative  Not on file   Social Drivers of Health   Financial Resource Strain: Low Risk  (08/18/2023)   Received from [facility]   Overall Financial Resource Strain (CARDIA)    Difficulty of Paying Living Expenses: Not hard at all  Food Insecurity: No Food Insecurity (08/18/2023)   Received from [facility]   Hunger Vital Sign    Worried About Running Out of Food in the Last Year: Never true    Ran Out of Food in the Last Year: Never true  Transportation Needs: No Transportation Needs (08/18/2023)   Received from [facility] - Transportation    In the past 12 months, has lack of transportation kept you from medical appointments or from getting medications?: No    Lack of Transportation (Non-Medical): No  Physical Activity: Not on file  Stress: Not on file  Social Connections: Not on file  Intimate Partner Violence: Not At Risk (07/15/2022)   Humiliation, Afraid, Rape, and Kick questionnaire    Fear of  Current or Ex-Partner: No    Emotionally Abused: No    Physically Abused: No    Sexually Abused: No    Past Surgical History:  Procedure Laterality Date   APPLICATION OF CRANIAL NAVIGATION Right 03/05/2021   Procedure: APPLICATION OF CRANIAL NAVIGATION;  Surgeon: Augusto Blonder, MD;  Location: [facility] OR;  Service: Neurosurgery;  Laterality: Right;   CESAREAN SECTION     CRANIOTOMY Right 03/05/2021   Procedure: STEREOTACTIC RIGHT FRONTAL CRANIOTOMY FOR RESECTION OF MENINGIOMA;  Surgeon: Augusto Blonder, MD;  Location: [facility] OR;  Service: Neurosurgery;  Laterality: Right;   HAND SURGERY Left    thumb    Family History  Problem Relation Age of Onset   Hypertension Mother    Arthritis Father    Diabetes Daughter    Arthritis Daughter     Allergies  Allergen Reactions   Tilactase Other (See Comments) and Cough    Congestion, also   Penicillins Rash       Latest Ref Rng & Units 07/16/2022    4:31 AM 07/14/2022    5:15 PM 03/05/2021    8:39 AM  CBC  WBC 4.0 - 10.5 K/uL 12.1  11.1    Hemoglobin 12.0 - 15.0 g/dL 9.6  98.1  9.5   Hematocrit 36.0 - 46.0 % 29.5  31.4  28.0   Platelets 150 - 400 K/uL 154  142         CMP     Component Value Date/Time   NA 140 07/18/2022 0605   K 4.0 07/18/2022 0605   CL 109 07/18/2022 0605   CO2 23 07/18/2022 0605   GLUCOSE 108 (H) 07/18/2022 0605   BUN 28 (H) 07/18/2022 0605   CREATININE 1.57 (H) 07/18/2022 0605   CALCIUM 9.3 07/18/2022 0605   PROT 6.8 07/14/2022 1900   ALBUMIN  2.9 (L) 07/14/2022 1900   AST 32 07/14/2022 1900   ALT 23 07/14/2022 1900   ALKPHOS 59 07/14/2022 1900   BILITOT 0.5 07/14/2022 1900   GFRNONAA 33 (L) 07/18/2022 0605     VAS US  ABI WITH/WO TBI Result Date: 10/12/2023  LOWER EXTREMITY DOPPLER STUDY Patient Name:  Kelli Shaffer  Date of Exam:   10/09/2023 Medical Rec #: [id number]   Accession #:    [id number] Date of Birth: 11-23-1940   Patient Gender: F Patient Age:   49 years Exam Location:  [facility]  Procedure:  VAS US  ABI WITH/WO TBI Referring Phys: Rosebud Confer --------------------------------------------------------------------------------  Indications: Abnormal ABI at other facility.  Performing Technologist: Tonie Franks RVS  Examination Guidelines: A complete evaluation includes at minimum, Doppler waveform signals and systolic blood pressure reading at the level of bilateral brachial, anterior tibial, and posterior tibial arteries, when vessel segments are accessible. Bilateral testing is considered an integral part of a complete examination. Photoelectric Plethysmograph (PPG) waveforms and toe systolic pressure readings are included as required and additional duplex testing as needed. Limited examinations for reoccurring indications may be performed as noted.  ABI Findings: +---------+------------------+-----+--------+--------+ Right    Rt Pressure (mmHg)IndexWaveformComment  +---------+------------------+-----+--------+--------+ Brachial 141                                     +---------+------------------+-----+--------+--------+ ATA      107               0.70 biphasic         +---------+------------------+-----+--------+--------+ PTA      121               0.80 biphasic         +---------+------------------+-----+--------+--------+ Great Toe63                0.41 Abnormal         +---------+------------------+-----+--------+--------+ +---------+------------------+-----+---------+-------+ Left     Lt Pressure (mmHg)IndexWaveform Comment +---------+------------------+-----+---------+-------+ Brachial 152                                     +---------+------------------+-----+---------+-------+ ATA      132               0.87 biphasic         +---------+------------------+-----+---------+-------+ PTA      146               0.96 triphasic        +---------+------------------+-----+---------+-------+ Great Toe166               1.09 Normal            +---------+------------------+-----+---------+-------+ +-------+-----------+-----------+------------+------------+ ABI/TBIToday's ABIToday's TBIPrevious ABIPrevious TBI +-------+-----------+-----------+------------+------------+ Right  .80        .41                                 +-------+-----------+-----------+------------+------------+ Left   .96        1.09                                +-------+-----------+-----------+------------+------------+  Summary: Right: Resting right ankle-brachial index indicates mild right lower extremity arterial disease. The right toe-brachial index is abnormal. Left: Resting left ankle-brachial index is within normal range. The left toe-brachial index is normal. *See table(s) above for measurements and observations.  Electronically signed by [name] MD on 10/12/2023 at 4:54:09 PM.    Final (Updated)        Assessment & Plan:   1. Atherosclerosis of native arteries of the extremities with ulceration (HCC) (Primary)  Recommend:  The patient has evidence of severe atherosclerotic changes of both lower extremities associated with ulceration and tissue loss of the right foot.  This represents a limb threatening ischemia and places the patient at the  risk for right limb loss.  Patient should undergo angiography of the right lower extremity with the hope for intervention for limb salvage.  The risks and benefits as well as the alternative therapies was discussed in detail with the patient.  All questions were answered.  Patient agrees to proceed with right angiography.  The patient will follow up with me in the office after the procedure.   2. Primary hypertension Continue antihypertensive medications as already ordered, these medications have been reviewed and there are no changes at this time.   Current Outpatient Medications on File Prior to Visit  Medication Sig Dispense Refill   acetaminophen  (TYLENOL ) 500 MG tablet Take 1,000 mg  by mouth daily as needed (for pain).     carvedilol  (COREG ) 25 MG tablet Take 0.5 tablets (12.5 mg total) by mouth 2 (two) times daily with a meal.     donepezil  (ARICEPT ) 10 MG tablet Take 10 mg by mouth daily.     fluticasone  (FLONASE ) 50 MCG/ACT nasal spray Place 1-2 sprays into both nostrils daily.     loratadine  (CLARITIN ) 10 MG tablet Take 10 mg by mouth daily.     Multiple Vitamins-Minerals (ONE-A-DAY PROACTIVE 65+) TABS Take 1 tablet by mouth daily with breakfast.     pregabalin (LYRICA) 25 MG capsule Take 25 mg by mouth 2 (two) times daily.     traMADol (ULTRAM) 50 MG tablet Take 25 mg by mouth every 8 (eight) hours as needed (for pain).     traZODone (DESYREL) 50 MG tablet Take 100 mg by mouth at bedtime as needed.     No current facility-administered medications on file prior to visit.    There are no Patient Instructions on file for this visit. No follow-ups on file.   Choua Chalker E Annica Marinello, NP

## 2023-11-24 ENCOUNTER — Encounter: Admitting: Physician Assistant

## 2023-11-24 DIAGNOSIS — I70211 Atherosclerosis of native arteries of extremities with intermittent claudication, right leg: Secondary | ICD-10-CM | POA: Diagnosis not present

## 2023-11-25 ENCOUNTER — Telehealth (INDEPENDENT_AMBULATORY_CARE_PROVIDER_SITE_OTHER): Payer: Self-pay

## 2023-11-25 NOTE — Telephone Encounter (Signed)
 Spoke with the patient's daughter and the patient is scheduled with Dr. Prescilla Brod for a RLE angio on 12/15/23 with a 9:00 am arrival time to the [facility]. Pre-procedure instructions were discussed and will be mailed.

## 2023-12-02 ENCOUNTER — Encounter: Admitting: Physician Assistant

## 2023-12-02 DIAGNOSIS — I70211 Atherosclerosis of native arteries of extremities with intermittent claudication, right leg: Secondary | ICD-10-CM | POA: Diagnosis not present

## 2023-12-04 ENCOUNTER — Telehealth (INDEPENDENT_AMBULATORY_CARE_PROVIDER_SITE_OTHER): Payer: Self-pay

## 2023-12-04 NOTE — Telephone Encounter (Signed)
 Kelli Shaffer pt's daughter called concerning her mom having surgery December 15 2023.  She states that her mom is non compliant and does no do anything she is supposed to do.  With this being said her daughter would like to know if there is anyway the pt can be sent to rehabilitation for a couple days after surgery to help the pt with walking.  Pt is dead weight and both daughters have back issues and think this would be beneficial.  Ruffin Cotton also states that she would like Sharla Davis NP to give her a call to discuss this further so the correct information is received.  I told her I would ask but you were with pts all day and may not be available to call her.  Please advise

## 2023-12-04 NOTE — Telephone Encounter (Signed)
 I called and spoke with patient's daughter Ruffin Cotton, regarding her request for patient to be placed in a rehabilitation facility.  I discussed that typically this requires a inpatient stay of several nights and that her procedure is typically an outpatient procedure.  Typically we will only hold the patient overnight for complication and usually only for more extended time frames if there are larger issues.  Therefore based on that we likely would not be able to facilitate an rehabilitation facility placement for the patient.  I have recommended physical therapy but the patient is currently getting physical therapy but has just started.  Patient will proceed with intervention as scheduled.

## 2023-12-09 ENCOUNTER — Encounter: Attending: Physician Assistant | Admitting: Physician Assistant

## 2023-12-09 DIAGNOSIS — N1832 Chronic kidney disease, stage 3b: Secondary | ICD-10-CM | POA: Diagnosis not present

## 2023-12-09 DIAGNOSIS — I70211 Atherosclerosis of native arteries of extremities with intermittent claudication, right leg: Secondary | ICD-10-CM | POA: Diagnosis not present

## 2023-12-09 DIAGNOSIS — L97818 Non-pressure chronic ulcer of other part of right lower leg with other specified severity: Secondary | ICD-10-CM | POA: Insufficient documentation

## 2023-12-15 ENCOUNTER — Encounter
Admission: RE | Disposition: A | Discharge status: Home / Self Care | Payer: Self-pay | Source: Home / Self Care | Attending: Vascular Surgery

## 2023-12-15 ENCOUNTER — Encounter: Payer: Self-pay | Admitting: Vascular Surgery

## 2023-12-15 ENCOUNTER — Ambulatory Visit
Admission: RE | Admit: 2023-12-15 | Discharge: 2023-12-15 | Disposition: A | Discharge status: Home / Self Care | Attending: Vascular Surgery | Admitting: Vascular Surgery

## 2023-12-15 ENCOUNTER — Other Ambulatory Visit: Payer: Self-pay

## 2023-12-15 DIAGNOSIS — E11621 Type 2 diabetes mellitus with foot ulcer: Secondary | ICD-10-CM | POA: Insufficient documentation

## 2023-12-15 DIAGNOSIS — Z79899 Other long term (current) drug therapy: Secondary | ICD-10-CM | POA: Diagnosis not present

## 2023-12-15 DIAGNOSIS — I10 Essential (primary) hypertension: Secondary | ICD-10-CM | POA: Diagnosis not present

## 2023-12-15 DIAGNOSIS — E1151 Type 2 diabetes mellitus with diabetic peripheral angiopathy without gangrene: Secondary | ICD-10-CM | POA: Diagnosis present

## 2023-12-15 DIAGNOSIS — I700 Atherosclerosis of aorta: Secondary | ICD-10-CM

## 2023-12-15 DIAGNOSIS — L97319 Non-pressure chronic ulcer of right ankle with unspecified severity: Secondary | ICD-10-CM | POA: Diagnosis not present

## 2023-12-15 DIAGNOSIS — I70299 Other atherosclerosis of native arteries of extremities, unspecified extremity: Secondary | ICD-10-CM

## 2023-12-15 DIAGNOSIS — I70233 Atherosclerosis of native arteries of right leg with ulceration of ankle: Secondary | ICD-10-CM

## 2023-12-15 HISTORY — DX: Prediabetes: R73.03

## 2023-12-15 HISTORY — DX: Chronic kidney disease, unspecified: N18.9

## 2023-12-15 HISTORY — DX: Fibromyalgia: M79.7

## 2023-12-15 HISTORY — PX: LOWER EXTREMITY ANGIOGRAPHY: CATH118251

## 2023-12-15 HISTORY — DX: Depression, unspecified: F32.A

## 2023-12-15 HISTORY — DX: Unspecified dementia, unspecified severity, without behavioral disturbance, psychotic disturbance, mood disturbance, and anxiety: F03.90

## 2023-12-15 LAB — GLUCOSE, CAPILLARY: Glucose-Capillary: 104 mg/dL — ABNORMAL HIGH (ref 70–99)

## 2023-12-15 LAB — CREATININE, SERUM
Creatinine, Ser: 1.49 mg/dL — ABNORMAL HIGH (ref 0.44–1.00)
GFR, Estimated: 34 mL/min — ABNORMAL LOW (ref >60)

## 2023-12-15 LAB — BUN: BUN: 27 mg/dL — ABNORMAL HIGH (ref 8–23)

## 2023-12-15 SURGERY — LOWER EXTREMITY ANGIOGRAPHY
Anesthesia: Moderate Sedation | Laterality: Right

## 2023-12-15 MED ORDER — HEPARIN (PORCINE) IN NACL 1000-0.9 UT/500ML-% IV SOLN
INTRAVENOUS | Status: DC | PRN
Start: 2023-12-15 — End: 2023-12-15
  Administered 2023-12-15: 1000 mL

## 2023-12-15 MED ORDER — LIDOCAINE HCL (PF) 1 % IJ SOLN
INTRAMUSCULAR | Status: DC | PRN
Start: 2023-12-15 — End: 2023-12-15
  Administered 2023-12-15: 10 mL

## 2023-12-15 MED ORDER — CLOPIDOGREL BISULFATE 75 MG PO TABS
ORAL_TABLET | ORAL | Status: AC
  Filled 2023-12-15: qty 4

## 2023-12-15 MED ORDER — MIDAZOLAM HCL 5 MG/5ML IJ SOLN
INTRAMUSCULAR | Status: AC
Start: 2023-12-15 — End: ?
  Filled 2023-12-15: qty 5

## 2023-12-15 MED ORDER — CEFAZOLIN SODIUM-DEXTROSE 2-4 GM/100ML-% IV SOLN
2.0000 g | INTRAVENOUS | Status: AC
  Administered 2023-12-15: 2 g via INTRAVENOUS

## 2023-12-15 MED ORDER — ASPIRIN 325 MG PO TBEC
325.0000 mg | DELAYED_RELEASE_TABLET | Freq: Once | ORAL | Status: AC
  Administered 2023-12-15: 325 mg via ORAL
  Filled 2023-12-15: qty 1

## 2023-12-15 MED ORDER — FAMOTIDINE 20 MG PO TABS: 40.0000 mg | ORAL_TABLET | Freq: Once | ORAL | Status: DC | PRN

## 2023-12-15 MED ORDER — FENTANYL CITRATE (PF) 100 MCG/2ML IJ SOLN
INTRAMUSCULAR | Status: AC
Start: 2023-12-15 — End: ?
  Filled 2023-12-15: qty 2

## 2023-12-15 MED ORDER — SODIUM CHLORIDE 0.9 % IV SOLN: INTRAVENOUS | Status: DC

## 2023-12-15 MED ORDER — IODIXANOL 320 MG/ML IV SOLN
INTRAVENOUS | Status: DC | PRN
  Administered 2023-12-15: 75 mL

## 2023-12-15 MED ORDER — SODIUM CHLORIDE 0.9 % BOLUS PEDS
INTRAVENOUS | Status: DC | PRN
  Administered 2023-12-15: 250 mL via INTRAVENOUS

## 2023-12-15 MED ORDER — ONDANSETRON HCL 4 MG/2ML IJ SOLN: 4.0000 mg | Freq: Four times a day (QID) | INTRAMUSCULAR | Status: DC | PRN

## 2023-12-15 MED ORDER — ASPIRIN 81 MG PO TBEC: 81.0000 mg | DELAYED_RELEASE_TABLET | Freq: Every day | ORAL | 1 refills | Status: AC

## 2023-12-15 MED ORDER — CLOPIDOGREL BISULFATE 75 MG PO TABS: 75.0000 mg | ORAL_TABLET | Freq: Every day | ORAL | 5 refills | Status: AC

## 2023-12-15 MED ORDER — SODIUM CHLORIDE 0.9% FLUSH: 3.0000 mL | INTRAVENOUS | Status: DC | PRN

## 2023-12-15 MED ORDER — FENTANYL CITRATE (PF) 100 MCG/2ML IJ SOLN
INTRAMUSCULAR | Status: DC | PRN
Start: 2023-12-15 — End: 2023-12-15
  Administered 2023-12-15 (×2): 25 ug via INTRAVENOUS
  Administered 2023-12-15: 50 ug via INTRAVENOUS

## 2023-12-15 MED ORDER — SODIUM CHLORIDE 0.9% FLUSH: 3.0000 mL | Freq: Two times a day (BID) | INTRAVENOUS | Status: DC

## 2023-12-15 MED ORDER — SODIUM CHLORIDE 0.9 % IV SOLN: 250.0000 mL | INTRAVENOUS | Status: DC | PRN

## 2023-12-15 MED ORDER — CEFAZOLIN SODIUM-DEXTROSE 2-4 GM/100ML-% IV SOLN
INTRAVENOUS | Status: AC
  Filled 2023-12-15: qty 100

## 2023-12-15 MED ORDER — HYDROMORPHONE HCL 1 MG/ML IJ SOLN: 1.0000 mg | Freq: Once | INTRAMUSCULAR | Status: DC | PRN

## 2023-12-15 MED ORDER — MIDAZOLAM HCL 2 MG/2ML IJ SOLN
INTRAMUSCULAR | Status: DC | PRN
  Administered 2023-12-15 (×2): 1 mg via INTRAVENOUS

## 2023-12-15 MED ORDER — HEPARIN SODIUM (PORCINE) 1000 UNIT/ML IJ SOLN
INTRAMUSCULAR | Status: DC | PRN
  Administered 2023-12-15: 7000 [IU] via INTRAVENOUS

## 2023-12-15 MED ORDER — MIDAZOLAM HCL 2 MG/ML PO SYRP: 8.0000 mg | ORAL_SOLUTION | Freq: Once | ORAL | Status: DC | PRN

## 2023-12-15 MED ORDER — DIPHENHYDRAMINE HCL 50 MG/ML IJ SOLN: 50.0000 mg | Freq: Once | INTRAMUSCULAR | Status: DC | PRN

## 2023-12-15 MED ORDER — HEPARIN SODIUM (PORCINE) 1000 UNIT/ML IJ SOLN
INTRAMUSCULAR | Status: AC
  Filled 2023-12-15: qty 10

## 2023-12-15 MED ORDER — CLOPIDOGREL BISULFATE 300 MG PO TABS
300.0000 mg | ORAL_TABLET | Freq: Once | ORAL | Status: AC
  Administered 2023-12-15: 300 mg via ORAL

## 2023-12-15 MED ORDER — METHYLPREDNISOLONE SODIUM SUCC 125 MG IJ SOLR: 125.0000 mg | Freq: Once | INTRAMUSCULAR | Status: DC | PRN

## 2023-12-15 SURGICAL SUPPLY — 22 items
BALLOON LUTONIX 018 6X150X130 (BALLOONS) IMPLANT
BALLOON ULTRVRSE 3X40X150 OTW (BALLOONS) IMPLANT
CATH ANGIO 5F PIGTAIL 65CM (CATHETERS) IMPLANT
CATH CXI SUPP 2.6F 150 ANG (CATHETERS) IMPLANT
CATH ROTAREX 135 6FR (CATHETERS) IMPLANT
CATH SEEKER .035X150CM (CATHETERS) IMPLANT
CATH VERT 5FR 125CM (CATHETERS) IMPLANT
COVER PROBE ULTRASOUND 5X96 (MISCELLANEOUS) IMPLANT
DEVICE PRESTO INFLATION (MISCELLANEOUS) IMPLANT
DEVICE STARCLOSE SE CLOSURE (Vascular Products) IMPLANT
GOWN STRL REUS W/ TWL LRG LVL3 (GOWN DISPOSABLE) ×1 IMPLANT
NDL ENTRY 21GA 7CM ECHOTIP (NEEDLE) IMPLANT
NEEDLE ENTRY 21GA 7CM ECHOTIP (NEEDLE) ×1 IMPLANT
PACK ANGIOGRAPHY (CUSTOM PROCEDURE TRAY) ×1 IMPLANT
SET INTRO CAPELLA COAXIAL (SET/KITS/TRAYS/PACK) IMPLANT
SHEATH BRITE TIP 5FRX11 (SHEATH) IMPLANT
SHEATH RAABE 6FR (SHEATH) IMPLANT
STENT LIFESTENT 5F 7X120X135 (Permanent Stent) IMPLANT
SYR MEDRAD MARK 7 150ML (SYRINGE) IMPLANT
TUBING CONTRAST HIGH PRESS 72 (TUBING) IMPLANT
WIRE G V18X300CM (WIRE) IMPLANT
WIRE J 3MM .035X145CM (WIRE) IMPLANT

## 2023-12-15 NOTE — Op Note (Signed)
 [facility]  Percutaneous Study/Intervention Procedural Note   Date of Surgery: 12/15/2023  Surgeon:  Jackquelyn Mass, MD.  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  Introduction catheter into right lower extremity 3rd order catheter placement              2.    Contrast injection right lower extremity for distal runoff             3.  Percutaneous transluminal angioplasty and stent placement right distal superficial femoral and above-knee popliteal artery.              4.  Atherectomy right SFA popliteal the R[place] Rex catheter.                          5.   Star close closure left common femoral arteriotomy  Anesthesia: Conscious sedation was administered under my direct supervision by the interventional radiology RN. IV Versed plus fentanyl  were utilized. Continuous ECG, pulse oximetry and blood pressure was monitored throughout the entire procedure.  Conscious sedation was for a total of 1 hour 59 minutes and 50 seconds.  Sheath: 6 [place] Rabie sheath left common femoral retrograde  Contrast: 75 cc  Fluoroscopy Time: 16 minutes  Indications:  Kelli Shaffer presents with nonhealing wound of the right ankle.  Noninvasive studies as well as physical exam increased risk for limb loss.  Angiography with hope for intervention is recommended for limb salvage.  The risks and benefits are reviewed all questions answered patient agrees to proceed.  Procedure:  Kelli Shaffer is a 83 y.o. y.o. female who was identified and appropriate procedural time out was performed.  The patient was then placed supine on the table and prepped and draped in the usual sterile fashion.    Ultrasound was placed in the sterile sleeve and the left groin was evaluated the left common femoral artery was echolucent and pulsatile indicating patency.  Image was recorded for the  permanent record and under real-time visualization a microneedle was inserted into the common femoral artery microwire followed by a micro-sheath.  A J-wire was then advanced through the micro-sheath and a  5 [place] sheath was then inserted over a J-wire. J-wire was then advanced and a 5 French pigtail catheter was positioned at the level of T12. AP projection of the aorta was then obtained. Pigtail catheter was repositioned to above the bifurcation and a LAO view of the pelvis was obtained.  Subsequently a pigtail catheter with the stiff angle Glidewire was used to cross the aortic bifurcation the catheter wire were advanced down into the right distal external iliac artery. Oblique view of the femoral bifurcation was then obtained and subsequently the wire was reintroduced and the pigtail catheter negotiated into the SFA representing third order catheter placement. Distal runoff was then performed.  6000 units of heparin  was then given and allowed to circulate and a 6 [place] Rabie sheath was advanced up and over the bifurcation and positioned in the femoral artery  KMP  catheter and stiff angle Glidewire were then negotiated down into the distal popliteal.  Distal runoff was then completed by hand injection through the catheter.  The Kumpe catheter to negotiate into the origin the anterior tibial.  I was then able to advance the wire down to the  level of the after tracking a CXI catheter down to the level of the ankle I was unable to reenter into the true lumen of the proximal pedis the wire catheter into the distal popliteal artery and distal peroneal.  Rex catheter was then wire distal SFA and above-knee popliteal.  The initial passes forward flow direction through the tightest area stenosis.  Pleurx catheter was removed and a 3 mm inflated to 10 atm Pleurx catheter was then finished and 4 passes made in both the antegrade and retrograde directions.  Please call 7 mm section for stent void across this lesion  and postdilated with a 6 mm x 150 mm balloon inflated to 10 atm for 1 minute.  Follow-up imaging demonstrated less than 10% residual stenosis throughout the SFA and popliteal.  Distal runoff was preserved.    After review of these images the sheath is pulled into the left external iliac oblique of the common femoral is obtained and a Star close device deployed. There no immediate complications.   Findings:  The abdominal aorta is opacified with a bolus injection contrast. Renal arteries are single and widely patent. The aorta itself has diffuse disease but no hemodynamically significant lesions. The common and external iliac arteries are widely patent bilaterally.  The right common femoral is widely patent as is the profunda femoris.  The SFA does indeed have a significant stenosis beginning just proximal to Hunter's canal with greater than 70 to 80% stenoses.  In the above-knee popliteal there is a subtotal occlusion.  The level of the femoral condyle popliteal is reconstituted appears relatively normal.  The distal popliteal demonstrates increasing disease and the trifurcation is heavily diseased with occlusion of the anterior tibial and posterior tibial arteries.  The tibioperoneal trunk and peroneal is widely patent down to the goal with large collaterals filling both the plantar arteries as well as the dorsalis pedis.    Following atherectomy there is now recanalization of the SFA and popliteal.  Following angioplasty and stent placement the SFA and above-knee popliteal is is now patent with in-line flow and looks quite nice there is less than 10% residual stenosis.     Summary: Successful recanalization right lower extremity for limb salvage                        Disposition: Patient was taken to the recovery room in stable condition having tolerated the procedure well.  Antwone Capozzoli, Ninette Basque 12/15/2023,12:53 PM

## 2023-12-15 NOTE — Interval H&P Note (Signed)
 History and Physical Interval Note:  12/15/2023 10:14 AM  Kelli Shaffer  has presented today for surgery, with the diagnosis of RLE Angio   ASO w ulceration.  The various methods of treatment have been discussed with the patient and family. After consideration of risks, benefits and other options for treatment, the patient has consented to  Procedure(s): Lower Extremity Angiography (Right) as a surgical intervention.  The patient's history has been reviewed, patient examined, no change in status, stable for surgery.  I have reviewed the patient's chart and labs.  Questions were answered to the patient's satisfaction.     [name]

## 2023-12-18 ENCOUNTER — Telehealth (INDEPENDENT_AMBULATORY_CARE_PROVIDER_SITE_OTHER): Payer: Self-pay

## 2023-12-18 NOTE — Telephone Encounter (Signed)
 Message from triage nurse that patient states had stents placed on 12/15/23 with right lower extremity. The area where the surgery was done the pt is having leg pain and swelling where the stents were placed. Please Advise

## 2023-12-18 NOTE — Telephone Encounter (Signed)
 Let's call the patient back and get some more details about her pain and if she has anything for it.

## 2023-12-18 NOTE — Telephone Encounter (Signed)
 Patient daughter reach out to informed that she spoke with her sister and she informed that their mother's swelling has went down. She has been elevating as advised. Patient daughter was informed that the swelling is normal after having angiogram. Patient daughter verbalized understanding.

## 2023-12-18 NOTE — Telephone Encounter (Signed)
 I spoke with patient daughter and she informed that the swelling was around the right knee but her mother has not elevating as advised by wound care provider. The pt is currently taking Tramadol and Lyrica for pain. A nursing agency will start today and will report to wound care. Patient daughter will contact if there are any other concerns.

## 2023-12-24 ENCOUNTER — Encounter: Admitting: Internal Medicine

## 2023-12-24 DIAGNOSIS — L97818 Non-pressure chronic ulcer of other part of right lower leg with other specified severity: Secondary | ICD-10-CM | POA: Diagnosis not present

## 2023-12-30 ENCOUNTER — Ambulatory Visit: Admitting: Physician Assistant

## 2023-12-30 DIAGNOSIS — I70219 Atherosclerosis of native arteries of extremities with intermittent claudication, unspecified extremity: Secondary | ICD-10-CM | POA: Insufficient documentation

## 2023-12-30 NOTE — Progress Notes (Signed)
 MRN : [id number]  Kelli Shaffer is a 83 y.o. (11-Oct-1940) female who presents with chief complaint of check circulation.  History of Present Illness:   The patient returns to the office for followup and review status post angiogram with intervention on 12/15/2023.   Procedure:  Percutaneous transluminal angioplasty and stent placement right distal superficial femoral and above-knee popliteal artery. 2.    Atherectomy right SFA popliteal the Rota Rex catheter.      The patient notes improvement in the lower extremity symptoms. No interval shortening of the patient's claudication distance or rest pain symptoms. No new ulcers or wounds have occurred since the last visit.  There have been no significant changes to the patient's overall health care.  No documented history of amaurosis fugax or recent TIA symptoms. There are no recent neurological changes noted. No documented history of DVT, PE or superficial thrombophlebitis. The patient denies recent episodes of angina or shortness of breath.    Past Medical History:  Diagnosis Date   Chronic kidney disease    Dementia (HCC)    Depression    Fibromyalgia    Frequent UTI    Hypertension    LVH (left ventricular hypertrophy)    Meningioma (HCC)    Osteoarthritis    Pre-diabetes     Past Surgical History:  Procedure Laterality Date   APPLICATION OF CRANIAL NAVIGATION Right 03/05/2021   Procedure: APPLICATION OF CRANIAL NAVIGATION;  Surgeon: Lanis Pupa, MD;  Location: [facility];  Service: Neurosurgery;  Laterality: Right;   CESAREAN SECTION     CRANIOTOMY Right 03/05/2021   Procedure: STEREOTACTIC RIGHT FRONTAL CRANIOTOMY FOR RESECTION OF MENINGIOMA;  Surgeon: Lanis Pupa, MD;  Location: [facility] OR;  Service: Neurosurgery;  Laterality: Right;   HAND SURGERY Left    thumb   LOWER EXTREMITY ANGIOGRAPHY Right 12/15/2023   Procedure: Lower Extremity Angiography;   Surgeon: Jama Cordella MATSU, MD;  Location: [facility];  Service: Cardiovascular;  Laterality: Right;    Social History Social History   Tobacco Use   Smoking status: Never   Smokeless tobacco: Never  Vaping Use   Vaping status: Never Used  Substance Use Topics   Alcohol use: Never   Drug use: Never    Family History Family History  Problem Relation Age of Onset   Hypertension Mother    Arthritis Father    Diabetes Daughter    Arthritis Daughter     Allergies  Allergen Reactions   Tilactase Other (See Comments) and Cough    Congestion, also   Penicillins Rash     REVIEW OF SYSTEMS (Negative unless checked)  Constitutional: [] Weight loss  [] Fever  [] Chills Cardiac: [] Chest pain   [] Chest pressure   [] Palpitations   [] Shortness of breath when laying flat   [] Shortness of breath with exertion. Vascular:  [x] Pain in legs with walking   [] Pain in legs at rest  [] History of DVT   [] Phlebitis   [] Swelling in legs   [] Varicose veins   [] Non-healing ulcers Pulmonary:   [] Uses home oxygen   [] Productive cough   [] Hemoptysis   [] Wheeze  []   COPD   [] Asthma Neurologic:  [] Dizziness   [] Seizures   [] History of stroke   [] History of TIA  [] Aphasia   [] Vissual changes   [] Weakness or numbness in arm   [] Weakness or numbness in leg Musculoskeletal:   [] Joint swelling   [] Joint pain   [] Low back pain Hematologic:  [] Easy bruising  [] Easy bleeding   [] Hypercoagulable state   [] Anemic Gastrointestinal:  [] Diarrhea   [] Vomiting  [] Gastroesophageal reflux/heartburn   [] Difficulty swallowing. Genitourinary:  [] Chronic kidney disease   [] Difficult urination  [] Frequent urination   [] Blood in urine Skin:  [] Rashes   [] Ulcers  Psychological:  [] History of anxiety   []  History of major depression.  Physical Examination  There were no vitals filed for this visit. There is no height or weight on file to calculate BMI. Gen: WD/WN, NAD Head: [place]/AT, No temporalis wasting.   Ear/Nose/Throat: Hearing grossly intact, nares w/o erythema or drainage Eyes: PER, EOMI, sclera nonicteric.  Neck: Supple, no masses.  No bruit or JVD.  Pulmonary:  Good air movement, no audible wheezing, no use of accessory muscles.  Cardiac: RRR, normal S1, S2, no Murmurs. Vascular:  mild trophic changes, no open wounds Vessel Right Left  Radial Palpable Palpable  PT Not Palpable Not Palpable  DP Not Palpable Not Palpable  Gastrointestinal: soft, non-distended. No guarding/no peritoneal signs.  Musculoskeletal: M/S 5/5 throughout.  No visible deformity.  Neurologic: CN 2-12 intact. Pain and light touch intact in extremities.  Symmetrical.  Speech is fluent. Motor exam as listed above. Psychiatric: Judgment intact, Mood & affect appropriate for pt's clinical situation. Dermatologic: No rashes or ulcers noted.  No changes consistent with cellulitis.   CBC Lab Results  Component Value Date   WBC 12.1 (H) 07/16/2022   HGB 9.6 (L) 07/16/2022   HCT 29.5 (L) 07/16/2022   MCV 93.9 07/16/2022   PLT 154 07/16/2022    BMET    Component Value Date/Time   NA 140 07/18/2022 0605   K 4.0 07/18/2022 0605   CL 109 07/18/2022 0605   CO2 23 07/18/2022 0605   GLUCOSE 108 (H) 07/18/2022 0605   BUN 27 (H) 12/15/2023 0918   CREATININE 1.49 (H) 12/15/2023 0918   CALCIUM 9.3 07/18/2022 0605   GFRNONAA 34 (L) 12/15/2023 0918   CrCl cannot be calculated (Unknown ideal weight.).  COAG No results found for: INR, PROTIME  Radiology PERIPHERAL VASCULAR CATHETERIZATION Result Date: 12/15/2023 See surgical note for result.    Assessment/Plan 1. Atherosclerosis of native artery of both lower extremities with intermittent claudication (HCC) (Primary)  Recommend:  The patient has evidence of atherosclerosis of the lower extremities with claudication.  The patient does not voice lifestyle limiting changes at this point in time.  Noninvasive studies do not suggest clinically significant  change.  No invasive studies, angiography or surgery at this time The patient should continue walking and begin a more formal exercise program.  The patient should continue antiplatelet therapy and aggressive treatment of the lipid abnormalities  No changes in the patient's medications at this time  Continued surveillance is indicated as atherosclerosis is likely to progress with time.    The patient will continue follow up with noninvasive studies as ordered.  - VAS US  LOWER EXTREMITY ARTERIAL DUPLEX; Future - VAS US  ABI WITH/WO TBI; Future  2. Lymphedema Recommend:  No surgery or intervention at this point in time.    I have reviewed my discussion with the patient regarding venous insufficiency and secondary lymph edema and  why it  causes symptoms. I have discussed with the patient the chronic skin changes that accompany these problems and the long term sequela such as ulceration and infection.  Patient will continue wearing graduated compression on a daily basis a prescription, if needed, was given to the patient to keep this updated. The patient will  put the compression on first thing in the morning and removing them in the evening. The patient is instructed specifically not to sleep in the compression.  In addition, behavioral modification including elevation during the day will be continued.  Diet and salt restriction will also be helpful.  Previous duplex ultrasound of the lower extremities shows normal deep venous system, superficial reflux was not present.   Following the review of the ultrasound the patient will follow up in 12 months to reassess the degree of swelling and the control that graduated compression is offering.   The patient can be assessed for a Lymph Pump at that time.  However, at this time the patient states they are satisfied with the control compression and elevation is yielding.    3. Primary hypertension Continue antihypertensive medications as already  ordered, these medications have been reviewed and there are no changes at this time.  4. GERD without esophagitis Continue PPI as already ordered, this medication has been reviewed and there are no changes at this time.  Avoidence of caffeine and alcohol  Moderate elevation of the head of the bed   5. Degeneration of intervertebral disc of lumbosacral region, unspecified whether pain present Continue medications to treat the patient's degenerative disease as already ordered, these medications have been reviewed and there are no changes at this time.  Continued activity and therapy was stressed.    Cordella Shawl, MD  12/30/2023 1:12 PM

## 2023-12-31 ENCOUNTER — Ambulatory Visit (INDEPENDENT_AMBULATORY_CARE_PROVIDER_SITE_OTHER): Admitting: Vascular Surgery

## 2023-12-31 ENCOUNTER — Encounter (INDEPENDENT_AMBULATORY_CARE_PROVIDER_SITE_OTHER): Payer: Self-pay | Admitting: Vascular Surgery

## 2023-12-31 VITALS — BP 136/84 | HR 60 | Ht <= 58 in | Wt 171.1 lb

## 2023-12-31 DIAGNOSIS — I890 Lymphedema, not elsewhere classified: Secondary | ICD-10-CM

## 2023-12-31 DIAGNOSIS — K219 Gastro-esophageal reflux disease without esophagitis: Secondary | ICD-10-CM | POA: Diagnosis not present

## 2023-12-31 DIAGNOSIS — I70213 Atherosclerosis of native arteries of extremities with intermittent claudication, bilateral legs: Secondary | ICD-10-CM

## 2023-12-31 DIAGNOSIS — M51379 Other intervertebral disc degeneration, lumbosacral region without mention of lumbar back pain or lower extremity pain: Secondary | ICD-10-CM

## 2023-12-31 DIAGNOSIS — I10 Essential (primary) hypertension: Secondary | ICD-10-CM

## 2024-01-03 ENCOUNTER — Encounter (INDEPENDENT_AMBULATORY_CARE_PROVIDER_SITE_OTHER): Payer: Self-pay | Admitting: Vascular Surgery

## 2024-01-06 ENCOUNTER — Encounter: Attending: Physician Assistant | Admitting: Physician Assistant

## 2024-01-06 DIAGNOSIS — I70211 Atherosclerosis of native arteries of extremities with intermittent claudication, right leg: Secondary | ICD-10-CM | POA: Insufficient documentation

## 2024-01-06 DIAGNOSIS — L97818 Non-pressure chronic ulcer of other part of right lower leg with other specified severity: Secondary | ICD-10-CM | POA: Insufficient documentation

## 2024-01-06 DIAGNOSIS — N1832 Chronic kidney disease, stage 3b: Secondary | ICD-10-CM | POA: Insufficient documentation

## 2024-01-13 ENCOUNTER — Encounter: Admitting: Physician Assistant

## 2024-01-13 DIAGNOSIS — L97818 Non-pressure chronic ulcer of other part of right lower leg with other specified severity: Secondary | ICD-10-CM | POA: Diagnosis not present

## 2024-01-20 ENCOUNTER — Encounter: Admitting: Physician Assistant

## 2024-01-20 DIAGNOSIS — L97818 Non-pressure chronic ulcer of other part of right lower leg with other specified severity: Secondary | ICD-10-CM | POA: Diagnosis not present

## 2024-01-26 ENCOUNTER — Ambulatory Visit: Admitting: Physician Assistant

## 2024-01-26 DIAGNOSIS — L97818 Non-pressure chronic ulcer of other part of right lower leg with other specified severity: Secondary | ICD-10-CM | POA: Diagnosis not present

## 2024-02-02 ENCOUNTER — Encounter: Admitting: Physician Assistant

## 2024-02-02 DIAGNOSIS — L97818 Non-pressure chronic ulcer of other part of right lower leg with other specified severity: Secondary | ICD-10-CM | POA: Diagnosis not present

## 2024-02-10 ENCOUNTER — Ambulatory Visit: Admitting: Physician Assistant

## 2024-02-17 ENCOUNTER — Encounter: Attending: Physician Assistant | Admitting: Physician Assistant

## 2024-02-17 DIAGNOSIS — N1832 Chronic kidney disease, stage 3b: Secondary | ICD-10-CM | POA: Diagnosis not present

## 2024-02-17 DIAGNOSIS — Z872 Personal history of diseases of the skin and subcutaneous tissue: Secondary | ICD-10-CM | POA: Insufficient documentation

## 2024-02-17 DIAGNOSIS — I70211 Atherosclerosis of native arteries of extremities with intermittent claudication, right leg: Secondary | ICD-10-CM | POA: Insufficient documentation

## 2024-02-17 DIAGNOSIS — Z09 Encounter for follow-up examination after completed treatment for conditions other than malignant neoplasm: Secondary | ICD-10-CM | POA: Diagnosis present

## 2024-03-30 ENCOUNTER — Other Ambulatory Visit (INDEPENDENT_AMBULATORY_CARE_PROVIDER_SITE_OTHER)

## 2024-03-30 ENCOUNTER — Ambulatory Visit (INDEPENDENT_AMBULATORY_CARE_PROVIDER_SITE_OTHER): Admitting: Nurse Practitioner

## 2024-03-30 ENCOUNTER — Ambulatory Visit (INDEPENDENT_AMBULATORY_CARE_PROVIDER_SITE_OTHER)

## 2024-03-30 ENCOUNTER — Encounter (INDEPENDENT_AMBULATORY_CARE_PROVIDER_SITE_OTHER): Payer: Self-pay | Admitting: Nurse Practitioner

## 2024-03-30 VITALS — BP 162/81 | HR 68 | Resp 16 | Ht <= 58 in | Wt 179.8 lb

## 2024-03-30 DIAGNOSIS — I70213 Atherosclerosis of native arteries of extremities with intermittent claudication, bilateral legs: Secondary | ICD-10-CM | POA: Diagnosis not present

## 2024-03-30 DIAGNOSIS — I10 Essential (primary) hypertension: Secondary | ICD-10-CM | POA: Diagnosis not present

## 2024-04-01 LAB — VAS US ABI WITH/WO TBI
Left ABI: 1.01
Right ABI: 1.03

## 2024-04-20 ENCOUNTER — Encounter (INDEPENDENT_AMBULATORY_CARE_PROVIDER_SITE_OTHER): Payer: Self-pay | Admitting: Nurse Practitioner

## 2024-04-20 NOTE — Progress Notes (Signed)
 Subjective:    Patient ID: Kelli Shaffer, female    DOB: 17-Jan-1941, 83 y.o.   MRN: [id number] Chief Complaint  Patient presents with   Follow-up    Left sided pain on knee area since her stent was placed     The patient returns to the office for followup regarding atherosclerotic changes of the lower extremities and review of the noninvasive studies.  She underwent intervention on 16 2025 on the right lower extremity.  There have been no interval changes in lower extremity symptoms. No interval shortening of the patient's claudication distance or development of rest pain symptoms. No new ulcers or wounds have occurred since the last visit.  There have been no significant changes to the patient's overall health care.  The patient denies amaurosis fugax or recent TIA symptoms. There are no documented recent neurological changes noted. There is no history of DVT, PE or superficial thrombophlebitis. The patient denies recent episodes of angina or shortness of breath.   ABI Rt=1.03 and Lt=1.01  (previous ABI's Rt=0.80 and Lt=0.96) Duplex ultrasound of the bilateral tibial vessels show triphasic waveforms with good toe waveforms bilaterally    Review of Systems  All other systems reviewed and are negative.      Objective:   Physical Exam Vitals reviewed.  HENT:     Head: Normocephalic.  Cardiovascular:     Rate and Rhythm: Normal rate.  Pulmonary:     Effort: Pulmonary effort is normal.  Skin:    General: Skin is warm and dry.  Neurological:     Mental Status: She is alert and oriented to person, place, and time.  Psychiatric:        Mood and Affect: Mood normal.        Behavior: Behavior normal.        Thought Content: Thought content normal.        Judgment: Judgment normal.     BP (!) 162/81 (BP Location: Right Arm, Patient Position: Sitting, Cuff Size: Large)   Pulse 68   Resp 16   Ht 4' 9 (1.448 m)   Wt 179 lb 12.8 oz (81.6 kg)   BMI 38.91 kg/m   Past Medical  History:  Diagnosis Date   Chronic kidney disease    Dementia (HCC)    Depression    Fibromyalgia    Frequent UTI    Hypertension    LVH (left ventricular hypertrophy)    Meningioma (HCC)    Osteoarthritis    Pre-diabetes     Social History   Socioeconomic History   Marital status: Widowed    Spouse name: Not on file   Number of children: Not on file   Years of education: Not on file   Highest education level: Not on file  Occupational History   Not on file  Tobacco Use   Smoking status: Never   Smokeless tobacco: Never  Vaping Use   Vaping status: Never Used  Substance and Sexual Activity   Alcohol use: Never   Drug use: Never   Sexual activity: Not on file  Other Topics Concern   Not on file  Social History Narrative   Not on file   Social Drivers of Health   Financial Resource Strain: Low Risk  (03/23/2024)   Received [facility]   Overall Financial Resource Strain (CARDIA)    Difficulty of Paying Living Expenses: Not very hard  Food Insecurity: No Food Insecurity (03/23/2024)   [facility] from [facility]   Hunger Vital Sign    Within the past 12 months, you worried that your food would run out before you got the money to buy more.: Never true    Within the past 12 months, the food you bought just didn't last and you didn't have money to get more.: Never true  Transportation Needs: No Transportation Needs (03/23/2024)   Received from [facility] - Transportation    In the past 12 months, has lack of transportation kept you from medical appointments or from getting medications?: No    Lack of Transportation (Non-Medical): No  Physical Activity: Not on file  Stress: Not on file  Social Connections: Not on file  Intimate Partner Violence: Not At Risk (07/15/2022)   Humiliation, Afraid, Rape, and Kick questionnaire    Fear of Current or Ex-Partner: No    Emotionally Abused: No    Physically  Abused: No    Sexually Abused: No    Past Surgical History:  Procedure Laterality Date   APPLICATION OF CRANIAL NAVIGATION Right 03/05/2021   Procedure: APPLICATION OF CRANIAL NAVIGATION;  Surgeon: Lanis Pupa, MD;  Location: [facility] OR;  Service: Neurosurgery;  Laterality: Right;   CESAREAN SECTION     CRANIOTOMY Right 03/05/2021   Procedure: STEREOTACTIC RIGHT FRONTAL CRANIOTOMY FOR RESECTION OF MENINGIOMA;  Surgeon: Lanis Pupa, MD;  Location: [facility] OR;  Service: Neurosurgery;  Laterality: Right;   HAND SURGERY Left    thumb   LOWER EXTREMITY ANGIOGRAPHY Right 12/15/2023   Procedure: Lower Extremity Angiography;  Surgeon: Jama Cordella MATSU, MD;  Location: [facility];  Service: Cardiovascular;  Laterality: Right;    Family History  Problem Relation Age of Onset   Hypertension Mother    Arthritis Father    Diabetes Daughter    Arthritis Daughter     Allergies  Allergen Reactions   Tilactase Other (See Comments) and Cough    Congestion, also   Penicillins Rash       Latest Ref Rng & Units 07/16/2022    4:31 AM 07/14/2022    5:15 PM 03/05/2021    8:39 AM  CBC  WBC 4.0 - 10.5 K/uL 12.1  11.1    Hemoglobin 12.0 - 15.0 g/dL 9.6  89.8  9.5   Hematocrit 36.0 - 46.0 % 29.5  31.4  28.0   Platelets 150 - 400 K/uL 154  142        CMP     Component Value Date/Time   NA 140 07/18/2022 0605   K 4.0 07/18/2022 0605   CL 109 07/18/2022 0605   CO2 23 07/18/2022 0605   GLUCOSE 108 (H) 07/18/2022 0605   BUN 27 (H) 12/15/2023 0918   CREATININE 1.49 (H) 12/15/2023 0918   CALCIUM 9.3 07/18/2022 0605   PROT 6.8 07/14/2022 1900   ALBUMIN  2.9 (L) 07/14/2022 1900   AST 32 07/14/2022 1900   ALT 23 07/14/2022 1900   ALKPHOS 59 07/14/2022 1900   BILITOT 0.5 07/14/2022 1900   GFRNONAA 34 (L) 12/15/2023 0918     VAS US  ABI WITH/WO TBI Result Date: 04/01/2024  LOWER EXTREMITY DOPPLER STUDY Patient Name:  Kelli Shaffer  Date of Exam:   03/30/2024 Medical Rec #: [id number]    Accession #:    [id number] Date of Birth: December 09, 1940   Patient Gender: F Patient Age:   83 years Exam Location:  [facility] Procedure:      VAS US  ABI WITH/WO  TBI Referring Phys: Cordella Shawl --------------------------------------------------------------------------------  Indications: Peripheral artery disease. Abnormal ABI at other facility.  Vascular Interventions: 12/15/2023: PTA and Stent placement Right Distal SFA and                         Above knee Popliteal Artery. Atherectomy of Right SFA                         Popliteal with the Rota Rex device catheter. Comparison Study: 10/09/2023 Performing Technologist: Leafy Gibes RVS  Examination Guidelines: A complete evaluation includes at minimum, Doppler waveform signals and systolic blood pressure reading at the level of bilateral brachial, anterior tibial, and posterior tibial arteries, when vessel segments are accessible. Bilateral testing is considered an integral part of a complete examination. Photoelectric Plethysmograph (PPG) waveforms and toe systolic pressure readings are included as required and additional duplex testing as needed. Limited examinations for reoccurring indications may be performed as noted.  ABI Findings: +---------+------------------+-----+---------+--------+ Right    Rt Pressure (mmHg)IndexWaveform Comment  +---------+------------------+-----+---------+--------+ Brachial 165                                      +---------+------------------+-----+---------+--------+ ATA      170               1.03 triphasic         +---------+------------------+-----+---------+--------+ PTA      159               0.96 triphasic         +---------+------------------+-----+---------+--------+ [place] Toe123               0.75 Normal            +---------+------------------+-----+---------+--------+ +---------+------------------+-----+---------+-------+ Left     Lt Pressure (mmHg)IndexWaveform  Comment +---------+------------------+-----+---------+-------+ Brachial 161                                     +---------+------------------+-----+---------+-------+ ATA      166               1.01 triphasic        +---------+------------------+-----+---------+-------+ PTA      160               0.97 triphasic        +---------+------------------+-----+---------+-------+ Great Toe182               1.10 Normal           +---------+------------------+-----+---------+-------+ +-------+-----------+-----------+------------+------------+ ABI/TBIToday's ABIToday's TBIPrevious ABIPrevious TBI +-------+-----------+-----------+------------+------------+ Right  1.03       .75        .80         .41          +-------+-----------+-----------+------------+------------+ Left   1.01       1.10       .96         1.09         +-------+-----------+-----------+------------+------------+  Bilateral ABIs appear increased compared to prior study on 10/09/2023. Left TBIs appear essentially unchanged compared to prior study on 10/09/2023. Right TBIs appear to be increased compared to prior study on 10/09/2023.  Summary: Right: Resting right ankle-brachial index is within normal range. The right toe-brachial index is normal.  Left: Resting left ankle-brachial index  is within normal range. The left toe-brachial index is normal.  *See table(s) above for measurements and observations.  Electronically signed by Cordella Shawl MD on 04/01/2024 at 7:13:58 AM.    Final        Assessment & Plan:   1. Atherosclerosis of native artery of both lower extremities with intermittent claudication (Primary)  Recommend:  The patient has evidence of atherosclerosis of the lower extremities with no claudication.  The patient does not voice lifestyle limiting changes at this point in time.  Noninvasive studies do not suggest clinically significant change.  No invasive studies, angiography or surgery at  this time The patient should continue walking and begin a more formal exercise program.  The patient should continue antiplatelet therapy and aggressive treatment of the lipid abnormalities  No changes in the patient's medications at this time  Continued surveillance is indicated as atherosclerosis is likely to progress with time.    The patient will continue follow up with noninvasive studies as ordered.   2. Primary hypertension Continue antihypertensive medications as already ordered, these medications have been reviewed and there are no changes at this time.   Current Outpatient Medications on File Prior to Visit  Medication Sig Dispense Refill   acetaminophen  (TYLENOL ) 500 MG tablet Take 1,000 mg by mouth daily as needed (for pain).     aspirin  EC 81 MG tablet Take 1 tablet (81 mg total) by mouth daily. Swallow whole. 150 tablet 1   carvedilol  (COREG ) 25 MG tablet Take 0.5 tablets (12.5 mg total) by mouth 2 (two) times daily with a meal.     cetirizine (ZYRTEC) 5 MG tablet Take 5 mg by mouth daily.     clopidogrel  (PLAVIX ) 75 MG tablet Take 1 tablet (75 mg total) by mouth daily. 30 tablet 5   donepezil  (ARICEPT ) 10 MG tablet Take 10 mg by mouth daily.     fluticasone  (FLONASE ) 50 MCG/ACT nasal spray Place 1-2 sprays into both nostrils daily.     Multiple Vitamins-Minerals (ONE-A-DAY PROACTIVE 65+) TABS Take 1 tablet by mouth daily with breakfast.     pregabalin (LYRICA) 25 MG capsule Take 25 mg by mouth 2 (two) times daily.     traMADol (ULTRAM) 50 MG tablet Take 25 mg by mouth every 8 (eight) hours as needed (for pain).     traZODone (DESYREL) 50 MG tablet Take 100 mg by mouth at bedtime as needed.     loratadine  (CLARITIN ) 10 MG tablet Take 10 mg by mouth daily. (Patient not taking: Reported on 03/30/2024)     No current facility-administered medications on file prior to visit.    There are no Patient Instructions on file for this visit. No follow-ups on file.   Arora Coakley E  Bijal Siglin, NP

## 2024-04-27 ENCOUNTER — Other Ambulatory Visit
Admission: RE | Admit: 2024-04-27 | Discharge: 2024-04-27 | Disposition: A | Discharge status: Home / Self Care | Source: Ambulatory Visit | Attending: Nephrology | Admitting: Nephrology

## 2024-09-28 ENCOUNTER — Ambulatory Visit (INDEPENDENT_AMBULATORY_CARE_PROVIDER_SITE_OTHER): Admitting: Nurse Practitioner

## 2024-09-28 ENCOUNTER — Encounter (INDEPENDENT_AMBULATORY_CARE_PROVIDER_SITE_OTHER)
